# Patient Record
Sex: Male | Born: 1995 | Race: Black or African American | Hispanic: No | Marital: Single | Smoking: Current every day smoker
Health system: Southern US, Community
[De-identification: ages and names within clinical notes are randomized; demographics above are authoritative.]

## PROBLEM LIST (undated history)

## (undated) DIAGNOSIS — F200 Paranoid schizophrenia: Secondary | ICD-10-CM

## (undated) DIAGNOSIS — F909 Attention-deficit hyperactivity disorder, unspecified type: Secondary | ICD-10-CM

## (undated) DIAGNOSIS — F329 Major depressive disorder, single episode, unspecified: Secondary | ICD-10-CM

## (undated) DIAGNOSIS — F32A Depression, unspecified: Secondary | ICD-10-CM

## (undated) DIAGNOSIS — Z21 Asymptomatic human immunodeficiency virus [HIV] infection status: Secondary | ICD-10-CM

## (undated) HISTORY — PX: ADENOIDECTOMY W/ MYRINGOTOMY: SHX1128

---
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
## 2014-08-12 ENCOUNTER — Encounter (HOSPITAL_BASED_OUTPATIENT_CLINIC_OR_DEPARTMENT_OTHER): Payer: Self-pay | Admitting: *Deleted

## 2014-08-12 ENCOUNTER — Emergency Department (HOSPITAL_BASED_OUTPATIENT_CLINIC_OR_DEPARTMENT_OTHER)
Admission: EM | Admit: 2014-08-12 | Discharge: 2014-08-12 | Disposition: A | Discharge status: Home / Self Care | Payer: Medicaid Other | Attending: Emergency Medicine | Admitting: Emergency Medicine

## 2014-08-12 DIAGNOSIS — Z7951 Long term (current) use of inhaled steroids: Secondary | ICD-10-CM | POA: Insufficient documentation

## 2014-08-12 DIAGNOSIS — F909 Attention-deficit hyperactivity disorder, unspecified type: Secondary | ICD-10-CM | POA: Insufficient documentation

## 2014-08-12 DIAGNOSIS — A5409 Other gonococcal infection of lower genitourinary tract: Secondary | ICD-10-CM | POA: Insufficient documentation

## 2014-08-12 DIAGNOSIS — F329 Major depressive disorder, single episode, unspecified: Secondary | ICD-10-CM | POA: Insufficient documentation

## 2014-08-12 DIAGNOSIS — Z79899 Other long term (current) drug therapy: Secondary | ICD-10-CM | POA: Insufficient documentation

## 2014-08-12 DIAGNOSIS — A549 Gonococcal infection, unspecified: Secondary | ICD-10-CM

## 2014-08-12 LAB — URINALYSIS, ROUTINE W REFLEX MICROSCOPIC
BILIRUBIN URINE: NEGATIVE
Glucose, UA: NEGATIVE mg/dL
HGB URINE DIPSTICK: NEGATIVE
Ketones, ur: 15 mg/dL — AB
Nitrite: NEGATIVE
Protein, ur: NEGATIVE mg/dL
SPECIFIC GRAVITY, URINE: 1.031 — AB (ref 1.005–1.030)
UROBILINOGEN UA: 1 mg/dL (ref 0.0–1.0)
pH: 6.5 (ref 5.0–8.0)

## 2014-08-12 LAB — URINE MICROSCOPIC-ADD ON

## 2014-08-12 MED ORDER — CEFTRIAXONE SODIUM 250 MG IJ SOLR
250.0000 mg | Freq: Once | INTRAMUSCULAR | Status: AC
  Administered 2014-08-12: 250 mg via INTRAMUSCULAR
  Filled 2014-08-12: qty 250

## 2014-08-12 MED ORDER — DOXYCYCLINE HYCLATE 100 MG PO CAPS: 100.0000 mg | ORAL_CAPSULE | Freq: Two times a day (BID) | ORAL | Status: DC

## 2014-08-12 NOTE — ED Notes (Signed)
Std exposure. Denies discharge.

## 2014-08-12 NOTE — ED Notes (Signed)
No s/s of a reaction.

## 2014-08-12 NOTE — ED Provider Notes (Signed)
CSN: [id number]     Arrival date & time 08/12/14  1413 History   First MD Initiated Contact with Patient 08/12/14 1524     Chief Complaint  Patient presents with  . SEXUALLY TRANSMITTED DISEASE     (Consider location/radiation/quality/duration/timing/severity/associated sxs/prior Treatment) The history is provided by the patient.   patient presents for treatment for gonorrhea. Reportedly was at his primary care doctor and was tested. He states he was called and told he has gonorrhea. he has had no symptoms and does not know where he got it from. Denies discharge. Denies pain. States they tested a rectal swab urine and blood work. He says he was told to go to the ER because of be easier to be treated that way.  Past Medical History  Diagnosis Date  . ADHD (attention deficit hyperactivity disorder)   . Depression    Past Surgical History  Procedure Laterality Date  . Adenoidectomy w/ myringotomy     No family history on file. History  Substance Use Topics  . Smoking status: Never Smoker   . Smokeless tobacco: Not on file  . Alcohol Use: No    Review of Systems  Constitutional: Negative for fever and chills.  Gastrointestinal: Negative for abdominal pain.  Genitourinary: Negative for flank pain, discharge, genital sores and penile pain.  Skin: Negative for rash.  Neurological: Negative for weakness.      Allergies  Review of patient's allergies indicates no known allergies.  Home Medications   Prior to Admission medications   Medication Sig Start Date End Date Taking? Authorizing Provider  buPROPion (WELLBUTRIN XL) 300 MG 24 hr tablet Take 300 mg by mouth daily.    Historical Provider, MD  citalopram (CELEXA) 40 MG tablet Take 40 mg by mouth daily.    Historical Provider, MD  doxycycline (VIBRAMYCIN) 100 MG capsule Take 1 capsule (100 mg total) by mouth 2 (two) times daily. 08/12/14   Benjiman CoreNathan Ravin Denardo, MD  fluticasone (FLONASE) 50 MCG/ACT nasal spray Place 2 sprays into  the nose daily.    Historical Provider, MD  guaiFENesin (ROBITUSSIN) 100 MG/5ML liquid Take 5-10 mLs (100-200 mg total) by mouth every 4 (four) hours as needed for cough. 08/17/12   Fayrene HelperBowie Tran, PA-C  magnesium citrate solution Take 296 mLs by mouth once. 12/27/11   Geoffery Lyonsouglas Delo, MD  methylphenidate (CONCERTA) 54 MG CR tablet Take 54 mg by mouth every morning.    Historical Provider, MD  Montelukast Sodium (SINGULAIR PO) Take by mouth.    Historical Provider, MD  risperiDONE (RISPERDAL) 2 MG tablet Take 2 mg by mouth 2 (two) times daily. 1/2 in the a.m. and a whole one at night    Historical Provider, MD  traZODone (DESYREL) 50 MG tablet Take 50 mg by mouth at bedtime.    Historical Provider, MD   BP 101/60 mmHg  Pulse 66  Temp(Src) 98.3 F (36.8 C) (Oral)  Resp 18  Ht 5\' 5"  (1.651 m)  Wt 150 lb (68.04 kg)  BMI 24.96 kg/m2  SpO2 99% Physical Exam  Constitutional: He appears well-developed.  Genitourinary: Penis normal. No penile tenderness.  No penile discharge or lesions  Musculoskeletal: Normal range of motion.  Skin: Skin is warm.    ED Course  Procedures (including critical care time) Labs Review Labs Reviewed  URINALYSIS, ROUTINE W REFLEX MICROSCOPIC (NOT AT [facility]) - Abnormal; Notable for the following:    Specific Gravity, Urine 1.031 (*)    Ketones, ur 15 (*)    Leukocytes,  UA SMALL (*)    All other components within normal limits  URINE MICROSCOPIC-ADD ON    Imaging Review No results found.   EKG Interpretation None      MDM   Final diagnoses:  Gonorrhea  patient reportedly with gonorrhea. I will assume he had proper testing and will be followed by the people who order the testing for necessarily public health issues and health department reporting. Given Rocephin and prescription for doxycycline and discharged home.  Benjiman Core, MD 08/12/14 [phone]

## 2014-08-12 NOTE — Discharge Instructions (Signed)

## 2015-03-13 ENCOUNTER — Encounter (HOSPITAL_BASED_OUTPATIENT_CLINIC_OR_DEPARTMENT_OTHER): Payer: Self-pay | Admitting: *Deleted

## 2015-03-13 ENCOUNTER — Emergency Department (HOSPITAL_BASED_OUTPATIENT_CLINIC_OR_DEPARTMENT_OTHER)
Admission: EM | Admit: 2015-03-13 | Discharge: 2015-03-13 | Disposition: A | Discharge status: Home / Self Care | Payer: BLUE CROSS/BLUE SHIELD | Attending: Emergency Medicine | Admitting: Emergency Medicine

## 2015-03-13 DIAGNOSIS — Z792 Long term (current) use of antibiotics: Secondary | ICD-10-CM | POA: Insufficient documentation

## 2015-03-13 DIAGNOSIS — J029 Acute pharyngitis, unspecified: Secondary | ICD-10-CM | POA: Diagnosis not present

## 2015-03-13 DIAGNOSIS — F909 Attention-deficit hyperactivity disorder, unspecified type: Secondary | ICD-10-CM | POA: Insufficient documentation

## 2015-03-13 DIAGNOSIS — Z79899 Other long term (current) drug therapy: Secondary | ICD-10-CM | POA: Insufficient documentation

## 2015-03-13 DIAGNOSIS — Z7951 Long term (current) use of inhaled steroids: Secondary | ICD-10-CM | POA: Insufficient documentation

## 2015-03-13 DIAGNOSIS — F329 Major depressive disorder, single episode, unspecified: Secondary | ICD-10-CM | POA: Diagnosis not present

## 2015-03-13 LAB — RAPID STREP SCREEN (MED CTR MEBANE ONLY): STREPTOCOCCUS, GROUP A SCREEN (DIRECT): NEGATIVE

## 2015-03-13 MED ORDER — NAPROXEN 375 MG PO TABS: 375.0000 mg | ORAL_TABLET | Freq: Two times a day (BID) | ORAL | Status: DC

## 2015-03-13 MED ORDER — IBUPROFEN 800 MG PO TABS
800.0000 mg | ORAL_TABLET | Freq: Once | ORAL | Status: AC
  Administered 2015-03-13: 800 mg via ORAL
  Filled 2015-03-13: qty 1

## 2015-03-13 NOTE — ED Provider Notes (Signed)
CSN: [id number]     Arrival date & time 03/13/15  0335 History   First MD Initiated Contact with Patient 03/13/15 [phone]     Chief Complaint  Patient presents with  . Sore Throat     (Consider location/radiation/quality/duration/timing/severity/associated sxs/prior Treatment) Patient is a 20 y.o. male presenting with pharyngitis. The history is provided by the patient.  Sore Throat This is a new problem. The current episode started 2 days ago. The problem occurs constantly. The problem has not changed since onset.Pertinent negatives include no chest pain, no abdominal pain, no headaches and no shortness of breath. Nothing aggravates the symptoms. Nothing relieves the symptoms. He has tried nothing for the symptoms. The treatment provided no relief.    Past Medical History  Diagnosis Date  . ADHD (attention deficit hyperactivity disorder)   . Depression    Past Surgical History  Procedure Laterality Date  . Adenoidectomy w/ myringotomy     History reviewed. No pertinent family history. Social History  Substance Use Topics  . Smoking status: Never Smoker   . Smokeless tobacco: None  . Alcohol Use: No    Review of Systems  Constitutional: Negative for fever.  HENT: Positive for congestion and sore throat. Negative for drooling and voice change.   Respiratory: Negative for shortness of breath.   Cardiovascular: Negative for chest pain.  Gastrointestinal: Negative for abdominal pain.  Neurological: Negative for headaches.  All other systems reviewed and are negative.     Allergies  Review of patient's allergies indicates no known allergies.  Home Medications   Prior to Admission medications   Medication Sig Start Date End Date Taking? Authorizing Provider  buPROPion (WELLBUTRIN XL) 300 MG 24 hr tablet Take 300 mg by mouth daily.    Historical Provider, MD  citalopram (CELEXA) 40 MG tablet Take 40 mg by mouth daily.    Historical Provider, MD  doxycycline (VIBRAMYCIN) 100  MG capsule Take 1 capsule (100 mg total) by mouth 2 (two) times daily. 08/12/14   Benjiman Core, MD  fluticasone (FLONASE) 50 MCG/ACT nasal spray Place 2 sprays into the nose daily.    Historical Provider, MD  guaiFENesin (ROBITUSSIN) 100 MG/5ML liquid Take 5-10 mLs (100-200 mg total) by mouth every 4 (four) hours as needed for cough. 08/17/12   Fayrene Helper, PA-C  magnesium citrate solution Take 296 mLs by mouth once. 12/27/11   Geoffery Lyons, MD  methylphenidate (CONCERTA) 54 MG CR tablet Take 54 mg by mouth every morning.    Historical Provider, MD  Montelukast Sodium (SINGULAIR PO) Take by mouth.    Historical Provider, MD  risperiDONE (RISPERDAL) 2 MG tablet Take 2 mg by mouth 2 (two) times daily. 1/2 in the a.m. and a whole one at night    Historical Provider, MD  traZODone (DESYREL) 50 MG tablet Take 50 mg by mouth at bedtime.    Historical Provider, MD   BP 125/66 mmHg  Pulse 66  Temp(Src) 99.5 F (37.5 C) (Oral)  Resp 16  Ht  (1.651 m)  Wt 148 lb (67.132 kg)  BMI 24.63 kg/m2  SpO2 100% Physical Exam  Constitutional: He is oriented to person, place, and time. He appears well-developed and well-nourished.  HENT:  Head: Normocephalic and atraumatic.  Mouth/Throat: Oropharynx is clear and moist.  Eyes: Conjunctivae and EOM are normal. Pupils are equal, round, and reactive to light.  Neck: Normal range of motion. Neck supple.  Intact phonation no pain with displacement of the trachea  Cardiovascular: Normal  rate, regular rhythm and intact distal pulses.   Pulmonary/Chest: Effort normal and breath sounds normal. No respiratory distress. He has no wheezes. He has no rales.  Abdominal: Soft. Bowel sounds are normal. There is no tenderness. There is no rebound and no guarding.  Musculoskeletal: Normal range of motion.  Lymphadenopathy:    He has no cervical adenopathy.  Neurological: He is alert and oriented to person, place, and time.  Skin: Skin is warm and dry.  Psychiatric: He  has a normal mood and affect.    ED Course  Procedures (including critical care time) Labs Review Labs Reviewed  RAPID STREP SCREEN (NOT AT [facility])  CULTURE, GROUP A STREP [facility])    Imaging Review No results found. I have personally reviewed and evaluated these images and lab results as part of my medical decision-making.   EKG Interpretation None      MDM   Final diagnoses:  None   Based on CENTOR score no indication for further testing or treatment  Viral pharyngitis.  Will treat symptomatically    Mylynn Dinh, MD 03/13/15 [phone]

## 2015-03-13 NOTE — ED Notes (Signed)
Sore throat x 2 days  Denies any other sx

## 2015-03-13 NOTE — Discharge Instructions (Signed)

## 2015-03-13 NOTE — ED Notes (Signed)
Sore throat onset 2 days ago got better and then returned yesterday am, denies any other symptoms

## 2015-03-14 ENCOUNTER — Emergency Department (HOSPITAL_BASED_OUTPATIENT_CLINIC_OR_DEPARTMENT_OTHER)
Admission: EM | Admit: 2015-03-14 | Discharge: 2015-03-15 | Disposition: A | Discharge status: Home / Self Care | Payer: BLUE CROSS/BLUE SHIELD | Attending: Emergency Medicine | Admitting: Emergency Medicine

## 2015-03-14 ENCOUNTER — Encounter (HOSPITAL_BASED_OUTPATIENT_CLINIC_OR_DEPARTMENT_OTHER): Payer: Self-pay | Admitting: Emergency Medicine

## 2015-03-14 DIAGNOSIS — F909 Attention-deficit hyperactivity disorder, unspecified type: Secondary | ICD-10-CM | POA: Insufficient documentation

## 2015-03-14 DIAGNOSIS — Z792 Long term (current) use of antibiotics: Secondary | ICD-10-CM | POA: Insufficient documentation

## 2015-03-14 DIAGNOSIS — Z79899 Other long term (current) drug therapy: Secondary | ICD-10-CM | POA: Insufficient documentation

## 2015-03-14 DIAGNOSIS — Z791 Long term (current) use of non-steroidal anti-inflammatories (NSAID): Secondary | ICD-10-CM | POA: Diagnosis not present

## 2015-03-14 DIAGNOSIS — Z7951 Long term (current) use of inhaled steroids: Secondary | ICD-10-CM | POA: Diagnosis not present

## 2015-03-14 DIAGNOSIS — K529 Noninfective gastroenteritis and colitis, unspecified: Secondary | ICD-10-CM | POA: Insufficient documentation

## 2015-03-14 DIAGNOSIS — F329 Major depressive disorder, single episode, unspecified: Secondary | ICD-10-CM | POA: Insufficient documentation

## 2015-03-14 DIAGNOSIS — R109 Unspecified abdominal pain: Secondary | ICD-10-CM | POA: Diagnosis present

## 2015-03-14 MED ORDER — ACETAMINOPHEN 325 MG PO TABS
650.0000 mg | ORAL_TABLET | Freq: Once | ORAL | Status: AC | PRN
  Administered 2015-03-14: 650 mg via ORAL
  Filled 2015-03-14: qty 2

## 2015-03-14 NOTE — ED Notes (Signed)
Stomach "cramps" since 4pm.  Vomited x4. Seen here for sore throat 2 days ago.

## 2015-03-15 LAB — BASIC METABOLIC PANEL
Anion gap: 9 (ref 5–15)
BUN: 14 mg/dL (ref 6–20)
CALCIUM: 9.2 mg/dL (ref 8.9–10.3)
CO2: 27 mmol/L (ref 22–32)
Chloride: 101 mmol/L (ref 101–111)
Creatinine, Ser: 1.03 mg/dL (ref 0.61–1.24)
GFR calc Af Amer: >60 mL/min (ref >60)
Glucose, Bld: 104 mg/dL — ABNORMAL HIGH (ref 65–99)
POTASSIUM: 3.6 mmol/L (ref 3.5–5.1)
SODIUM: 137 mmol/L (ref 135–145)

## 2015-03-15 LAB — CULTURE, GROUP A STREP (THRC)

## 2015-03-15 LAB — CBC WITH DIFFERENTIAL/PLATELET
Basophils Absolute: 0 10*3/uL (ref 0.0–0.1)
Basophils Relative: 0 %
Eosinophils Absolute: 0 10*3/uL (ref 0.0–0.7)
Eosinophils Relative: 0 %
HEMATOCRIT: 37 % — AB (ref 39.0–52.0)
HEMOGLOBIN: 12.2 g/dL — AB (ref 13.0–17.0)
Lymphocytes Relative: 10 %
Lymphs Abs: 0.8 10*3/uL (ref 0.7–4.0)
MCH: 30 pg (ref 26.0–34.0)
MCHC: 33 g/dL (ref 30.0–36.0)
MCV: 90.9 fL (ref 78.0–100.0)
MONO ABS: 0.7 10*3/uL (ref 0.1–1.0)
Monocytes Relative: 8 %
NEUTROS ABS: 7 10*3/uL (ref 1.7–7.7)
NEUTROS PCT: 82 %
Platelets: 191 10*3/uL (ref 150–400)
RBC: 4.07 MIL/uL — AB (ref 4.22–5.81)
RDW: 11.4 % — ABNORMAL LOW (ref 11.5–15.5)
WBC: 8.5 10*3/uL (ref 4.0–10.5)

## 2015-03-15 MED ORDER — SODIUM CHLORIDE 0.9 % IV BOLUS (SEPSIS)
1000.0000 mL | Freq: Once | INTRAVENOUS | Status: AC
  Administered 2015-03-15: 1000 mL via INTRAVENOUS

## 2015-03-15 MED ORDER — ONDANSETRON HCL 4 MG/2ML IJ SOLN
4.0000 mg | Freq: Once | INTRAMUSCULAR | Status: AC
  Administered 2015-03-15: 4 mg via INTRAVENOUS
  Filled 2015-03-15: qty 2

## 2015-03-15 MED ORDER — KETOROLAC TROMETHAMINE 30 MG/ML IJ SOLN
30.0000 mg | Freq: Once | INTRAMUSCULAR | Status: AC
  Administered 2015-03-15: 30 mg via INTRAVENOUS
  Filled 2015-03-15: qty 1

## 2015-03-15 MED ORDER — ONDANSETRON 8 MG PO TBDP: ORAL_TABLET | ORAL | Status: DC

## 2015-03-15 NOTE — Discharge Instructions (Signed)
Zofran as prescribed as needed for nausea.  Return to the emergency department if he develops severe abdominal pain, bloody stools, no urine output in 12 hours, or other new and concerning symptoms.

## 2015-03-15 NOTE — ED Provider Notes (Signed)
CSN: [id number]     Arrival date & time 03/14/15  2340 History  By signing my name below, I, Terrance Branch, attest that this documentation has been prepared under the direction and in the presence of Geoffery Lyons, MD. Electronically Signed: Evon Slack, ED Scribe. 03/15/2015. 12:05 AM.     Chief Complaint  Patient presents with  . Abdominal Pain   Patient is a 20 y.o. male presenting with abdominal pain. The history is provided by the patient. No language interpreter was used.  Abdominal Pain Associated symptoms: chills, diarrhea, nausea and vomiting    HPI Comments: Gary Bird is a 20 y.o. male who presents to the Emergency Department complaining of cramping abdominal pain onset today. Pt reports associated chills, vomiting and diarrhea. Pt doesn't report any medications PTA.  Pt denies any recent sick contacts. Pt does report recently being seen in the ED for viral pharyngitis.    Past Medical History  Diagnosis Date  . ADHD (attention deficit hyperactivity disorder)   . Depression    Past Surgical History  Procedure Laterality Date  . Adenoidectomy w/ myringotomy     No family history on file. Social History  Substance Use Topics  . Smoking status: Never Smoker   . Smokeless tobacco: None  . Alcohol Use: No    Review of Systems  Constitutional: Positive for chills.  Gastrointestinal: Positive for nausea, vomiting, abdominal pain and diarrhea.     Allergies  Review of patient's allergies indicates no known allergies.  Home Medications   Prior to Admission medications   Medication Sig Start Date End Date Taking? Authorizing Provider  buPROPion (WELLBUTRIN XL) 300 MG 24 hr tablet Take 300 mg by mouth daily.    Historical Provider, MD  citalopram (CELEXA) 40 MG tablet Take 40 mg by mouth daily.    Historical Provider, MD  doxycycline (VIBRAMYCIN) 100 MG capsule Take 1 capsule (100 mg total) by mouth 2 (two) times daily. 08/12/14   Benjiman Core, MD   fluticasone (FLONASE) 50 MCG/ACT nasal spray Place 2 sprays into the nose daily.    Historical Provider, MD  guaiFENesin (ROBITUSSIN) 100 MG/5ML liquid Take 5-10 mLs (100-200 mg total) by mouth every 4 (four) hours as needed for cough. 08/17/12   Fayrene Helper, PA-C  magnesium citrate solution Take 296 mLs by mouth once. 12/27/11   Geoffery Lyons, MD  methylphenidate (CONCERTA) 54 MG CR tablet Take 54 mg by mouth every morning.    Historical Provider, MD  Montelukast Sodium (SINGULAIR PO) Take by mouth.    Historical Provider, MD  naproxen (NAPROSYN) 375 MG tablet Take 1 tablet (375 mg total) by mouth 2 (two) times daily. 03/13/15   April Palumbo, MD  risperiDONE (RISPERDAL) 2 MG tablet Take 2 mg by mouth 2 (two) times daily. 1/2 in the a.m. and a whole one at night    Historical Provider, MD  traZODone (DESYREL) 50 MG tablet Take 50 mg by mouth at bedtime.    Historical Provider, MD   BP 126/75 mmHg  Pulse 88  Temp(Src) 102.8 F (39.3 C) (Oral)  Resp 16  Ht  (1.651 m)  Wt 148 lb (67.132 kg)  BMI 24.63 kg/m2  SpO2 98%   Physical Exam  Constitutional: He is oriented to person, place, and time. He appears well-developed and well-nourished. No distress.  HENT:  Head: Normocephalic and atraumatic.  Mouth/Throat: Oropharynx is clear and moist. No oropharyngeal exudate.  Eyes: Conjunctivae and EOM are normal.  Neck: Neck supple.  No tracheal deviation present.  Cardiovascular: Normal rate, regular rhythm and normal heart sounds.   No murmur heard. Pulmonary/Chest: Effort normal and breath sounds normal. No respiratory distress. He has no wheezes. He has no rales.  Abdominal: Soft. There is no tenderness.  Musculoskeletal: Normal range of motion.  Neurological: He is alert and oriented to person, place, and time.  Skin: Skin is warm and dry. He is not diaphoretic.  Psychiatric: He has a normal mood and affect. His behavior is normal.  Nursing note and vitals reviewed.   ED Course   Procedures (including critical care time) DIAGNOSTIC STUDIES: Oxygen Saturation is 98% on RA, normal by my interpretation.    COORDINATION OF CARE: 12:06 AM-Discussed treatment plan with pt at bedside and pt agreed to plan.     Labs Review Labs Reviewed - No data to display  Imaging Review No results found.    EKG Interpretation None      MDM   Final diagnoses:  None      Patient presents with nausea, vomiting, and diarrhea. He also has a fever. His abdomen is benign. His presentation, exam, and laboratory studies are consistent with a gastroenteritis, likely viral in nature. He is feeling better after fluids and medications. He will be discharged with Zofran, clear liquids, and when necessary return.   I personally performed the services described in this documentation, which was scribed in my presence. The recorded information has been reviewed and is accurate.       Geoffery Lyons, MD 03/15/15 [phone]

## 2015-06-02 ENCOUNTER — Emergency Department (HOSPITAL_BASED_OUTPATIENT_CLINIC_OR_DEPARTMENT_OTHER)
Admission: EM | Admit: 2015-06-02 | Discharge: 2015-06-02 | Disposition: A | Discharge status: Home / Self Care | Payer: BLUE CROSS/BLUE SHIELD | Attending: Emergency Medicine | Admitting: Emergency Medicine

## 2015-06-02 ENCOUNTER — Encounter (HOSPITAL_BASED_OUTPATIENT_CLINIC_OR_DEPARTMENT_OTHER): Payer: Self-pay | Admitting: *Deleted

## 2015-06-02 DIAGNOSIS — F329 Major depressive disorder, single episode, unspecified: Secondary | ICD-10-CM | POA: Insufficient documentation

## 2015-06-02 DIAGNOSIS — L02416 Cutaneous abscess of left lower limb: Secondary | ICD-10-CM | POA: Diagnosis not present

## 2015-06-02 DIAGNOSIS — L03115 Cellulitis of right lower limb: Secondary | ICD-10-CM | POA: Diagnosis present

## 2015-06-02 DIAGNOSIS — L02415 Cutaneous abscess of right lower limb: Secondary | ICD-10-CM

## 2015-06-02 MED ORDER — SULFAMETHOXAZOLE-TRIMETHOPRIM 800-160 MG PO TABS: 1.0000 | ORAL_TABLET | Freq: Two times a day (BID) | ORAL | Status: AC

## 2015-06-02 MED ORDER — CEPHALEXIN 500 MG PO CAPS: 500.0000 mg | ORAL_CAPSULE | Freq: Three times a day (TID) | ORAL | Status: DC

## 2015-06-02 NOTE — Discharge Instructions (Signed)
Abscess °An abscess is an infected area that contains a collection of pus and debris. It can occur in almost any part of the body. An abscess is also known as a furuncle or boil. °CAUSES  °An abscess occurs when tissue gets infected. This can occur from blockage of oil or sweat glands, infection of hair follicles, or a minor injury to the skin. As the body tries to fight the infection, pus collects in the area and creates pressure under the skin. This pressure causes pain. People with weakened immune systems have difficulty fighting infections and get certain abscesses more often.  °SYMPTOMS °Usually an abscess develops on the skin and becomes a painful mass that is red, warm, and tender. If the abscess forms under the skin, you may feel a moveable soft area under the skin. Some abscesses break open (rupture) on their own, but most will continue to get worse without care. The infection can spread deeper into the body and eventually into the bloodstream, causing you to feel ill.  °DIAGNOSIS  °Your caregiver will take your medical history and perform a physical exam. A sample of fluid may also be taken from the abscess to determine what is causing your infection. °TREATMENT  °Your caregiver may prescribe antibiotic medicines to fight the infection. However, taking antibiotics alone usually does not cure an abscess. Your caregiver may need to make a small cut (incision) in the abscess to drain the pus. In some cases, gauze is packed into the abscess to reduce pain and to continue draining the area. °HOME CARE INSTRUCTIONS  °· Only take over-the-counter or prescription medicines for pain, discomfort, or fever as directed by your caregiver. °· If you were prescribed antibiotics, take them as directed. Finish them even if you start to feel better. °· If gauze is used, follow your caregiver's directions for changing the gauze. °· To avoid spreading the infection: °· Keep your draining abscess covered with a  bandage. °· Wash your hands well. °· Do not share personal care items, towels, or whirlpools with others. °· Avoid skin contact with others. °· Keep your skin and clothes clean around the abscess. °· Keep all follow-up appointments as directed by your caregiver. °SEEK MEDICAL CARE IF:  °· You have increased pain, swelling, redness, fluid drainage, or bleeding. °· You have muscle aches, chills, or a general ill feeling. °· You have a fever. °MAKE SURE YOU:  °· Understand these instructions. °· Will watch your condition. °· Will get help right away if you are not doing well or get worse. °  °This information is not intended to replace advice given to you by your health care provider. Make sure you discuss any questions you have with your health care provider. °  °Document Released: 10/20/2004 Document Revised: 07/12/2011 Document Reviewed: 03/25/2011 °Elsevier Interactive Patient Education ©2016 [employer] ° °Incision and Drainage °Incision and drainage is a procedure in which a sac-like structure (cystic structure) is opened and drained. The area to be drained usually contains material such as pus, fluid, or blood.  °LET YOUR CAREGIVER KNOW ABOUT:  °· Allergies to medicine. °· Medicines taken, including vitamins, herbs, eyedrops, over-the-counter medicines, and creams. °· Use of steroids (by mouth or creams). °· Previous problems with anesthetics or numbing medicines. °· History of bleeding problems or blood clots. °· Previous surgery. °· Other health problems, including diabetes and kidney problems. °· Possibility of pregnancy, if this applies. °RISKS AND COMPLICATIONS °· Pain. °· Bleeding. °· Scarring. °· Infection. °BEFORE THE PROCEDURE  °  You may need to have an ultrasound or other imaging tests to see how large or deep your cystic structure is. Blood tests may also be used to determine if you have an infection or how severe the infection is. You may need to have a tetanus shot. °PROCEDURE  °The affected area  is cleaned with a cleaning fluid. The cyst area will then be numbed with a medicine (local anesthetic). A small incision will be made in the cystic structure. A syringe or catheter may be used to drain the contents of the cystic structure, or the contents may be squeezed out. The area will then be flushed with a cleansing solution. After cleansing the area, it is often gently packed with a gauze or another wound dressing. Once it is packed, it will be covered with gauze and tape or some other type of wound dressing.  °AFTER THE PROCEDURE  °· Often, you will be allowed to go home right after the procedure. °· You may be given antibiotic medicine to prevent or heal an infection. °· If the area was packed with gauze or some other wound dressing, you will likely need to come back in 1 to 2 days to get it removed. °· The area should heal in about 14 days. °  °This information is not intended to replace advice given to you by your health care provider. Make sure you discuss any questions you have with your health care provider. °  °Document Released: 07/06/2000 Document Revised: 07/12/2011 Document Reviewed: 03/07/2011 °Elsevier Interactive Patient Education ©2016 [employer] Inc[employer] ° °

## 2015-06-02 NOTE — ED Provider Notes (Signed)
CSN: [id number]     Arrival date & time 06/02/15  2234 History   First MD Initiated Contact with Patient 06/02/15 2316     Chief Complaint  Patient presents with  . Skin Ulcer     (Consider location/radiation/quality/duration/timing/severity/associated sxs/prior Treatment) HPI This is a 20 year old male who comes into the emergency department with chief complaint of infection of the right lower leg. He noticed a small pimple-like protrusion on the right lower leg that developed several days ago. Patient has had worsening swelling, heat, redness and a feeling of pressure in the leg. He has a past medical history of previous boils. His mother also gets them. He denies any fevers, chills, nausea, vomiting, or other symptoms of systemic infection.  Past Medical History  Diagnosis Date  . ADHD (attention deficit hyperactivity disorder)   . Depression    Past Surgical History  Procedure Laterality Date  . Adenoidectomy w/ myringotomy     No family history on file. Social History  Substance Use Topics  . Smoking status: Never Smoker   . Smokeless tobacco: None  . Alcohol Use: No    Review of Systems Ten systems reviewed and are negative for acute change, except as noted in the HPI.     Allergies  Review of patient's allergies indicates no known allergies.  Home Medications   Prior to Admission medications   Medication Sig Start Date End Date Taking? Authorizing Provider  buPROPion (WELLBUTRIN XL) 300 MG 24 hr tablet Take 300 mg by mouth daily.    Historical Provider, MD  citalopram (CELEXA) 40 MG tablet Take 40 mg by mouth daily.    Historical Provider, MD  doxycycline (VIBRAMYCIN) 100 MG capsule Take 1 capsule (100 mg total) by mouth 2 (two) times daily. 08/12/14   Benjiman Core, MD  fluticasone (FLONASE) 50 MCG/ACT nasal spray Place 2 sprays into the nose daily.    Historical Provider, MD  guaiFENesin (ROBITUSSIN) 100 MG/5ML liquid Take 5-10 mLs (100-200 mg total) by mouth  every 4 (four) hours as needed for cough. 08/17/12   Fayrene Helper, PA-C  magnesium citrate solution Take 296 mLs by mouth once. 12/27/11   Geoffery Lyons, MD  methylphenidate (CONCERTA) 54 MG CR tablet Take 54 mg by mouth every morning.    Historical Provider, MD  Montelukast Sodium (SINGULAIR PO) Take by mouth.    Historical Provider, MD  naproxen (NAPROSYN) 375 MG tablet Take 1 tablet (375 mg total) by mouth 2 (two) times daily. 03/13/15   April Palumbo, MD  ondansetron (ZOFRAN ODT) 8 MG disintegrating tablet  ODT q4 hours prn nausea 03/15/15   Geoffery Lyons, MD  risperiDONE (RISPERDAL) 2 MG tablet Take 2 mg by mouth 2 (two) times daily. 1/2 in the a.m. and a whole one at night    Historical Provider, MD  traZODone (DESYREL) 50 MG tablet Take 50 mg by mouth at bedtime.    Historical Provider, MD   BP 107/71 mmHg  Pulse 71  Temp(Src) 98.8 F (37.1 C) (Oral)  Resp 17  Ht  (1.651 m)  Wt 67.132 kg  BMI 24.63 kg/m2  SpO2 100% Physical Exam Physical Exam  Nursing note and vitals reviewed. Constitutional: He appears well-developed and well-nourished. No distress.  HENT:  Head: Normocephalic and atraumatic.  Eyes: Conjunctivae normal are normal. No scleral icterus.  Neck: Normal range of motion. Neck supple.  Cardiovascular: Normal rate, regular rhythm and normal heart sounds.   Pulmonary/Chest: Effort normal and breath sounds normal. No respiratory  distress.  Abdominal: Soft. There is no tenderness.  Musculoskeletal: He exhibits no edema.  Neurological: He is alert.  Skin: Skin is warm and dry. He is not diaphoretic.  Right lower extremity with 4 cm area of erythema with a central ulceration actively draining purulent and bloody drainage. No lymphangitis. Psychiatric: His behavior is normal.   INCISION AND DRAINAGE Performed by: Arthor CaptainHarris, Kanyon Bunn Consent: Verbal consent obtained. Risks and benefits: risks, benefits and alternatives were discussed Type: abscess  Body area: Right lower  leg  Anesthesia: local infiltration  Incision was made with a scalpel.  Local anesthetic: lidocaine 2% % with epinephrine  Anesthetic total: 3 ml  Complexity: complex Blunt dissection to break up loculations  Drainage: purulent  Drainage amount: Moderate   Packing material: 1/4 in iodoform gauze  Patient tolerance: Patient tolerated the procedure well with no immediate complications.    ED Course  Procedures (including critical care time) Labs Review Labs Reviewed - No data to display  Imaging Review No results found. I have personally reviewed and evaluated these images and lab results as part of my medical decision-making.   EKG Interpretation None      MDM   Final diagnoses:  Abscess of leg, right    Patient with skin abscess amenable to incision and drainage.  Abscess was not large enough to warrant packing or drain,  wound recheck in 2 days. Encouraged home warm soaks and flushing.  Mild signs of cellulitis is surrounding skin.  Will d/c to home.  Patient discharged with Keflex and Bactrim. Discussed return per Cautions. Abscess drainage sent for culture    Arthor Captainbigail Tyrihanna Wingert, PA-C 06/03/15 0019  Paula LibraJohn Molpus, MD 06/03/15 [phone]

## 2015-06-02 NOTE — ED Notes (Signed)
Provider at bedside for pt eval.

## 2015-06-02 NOTE — ED Notes (Signed)
Provider at bedside to lance wound

## 2015-06-02 NOTE — ED Notes (Signed)
Pt. Reports itching bump started on the Mid calf on the R leg with redness noted and draining. Pt. Reports pain in the leg and surrounding tissue.

## 2015-06-06 LAB — CULTURE, ROUTINE-ABSCESS

## 2015-06-07 ENCOUNTER — Telehealth (HOSPITAL_BASED_OUTPATIENT_CLINIC_OR_DEPARTMENT_OTHER): Payer: Self-pay

## 2015-06-07 NOTE — Telephone Encounter (Signed)
Post ED Visit - Positive Culture Follow-up  Culture report reviewed by antimicrobial stewardship pharmacist:  [x]  Gary Bird, Pharm.D. []  Gary Bird, Pharm.D., BCPS []  Gary Bird, Pharm.D. []  Gary Bird, Pharm.D., BCPS []  Gary Bird, [address] BCPS, AAHIVP []  Gary Bird, Pharm.D., BCPS, AAHIVP []  Gary Bird, [address] []  Gary Bird, [address]  Positive urine culture Treated with Sulfamethoxazole-Trimethoprim, organism sensitive to the same and no further patient follow-up is required at this time.  Gary Bird, Gary Bird 06/07/2015, 12:53 PM

## 2015-07-17 ENCOUNTER — Encounter (HOSPITAL_BASED_OUTPATIENT_CLINIC_OR_DEPARTMENT_OTHER): Payer: Self-pay | Admitting: *Deleted

## 2015-07-17 ENCOUNTER — Emergency Department (HOSPITAL_BASED_OUTPATIENT_CLINIC_OR_DEPARTMENT_OTHER)
Admission: EM | Admit: 2015-07-17 | Discharge: 2015-07-17 | Disposition: A | Discharge status: Home / Self Care | Payer: BLUE CROSS/BLUE SHIELD | Attending: Emergency Medicine | Admitting: Emergency Medicine

## 2015-07-17 DIAGNOSIS — F17210 Nicotine dependence, cigarettes, uncomplicated: Secondary | ICD-10-CM | POA: Diagnosis not present

## 2015-07-17 DIAGNOSIS — H109 Unspecified conjunctivitis: Secondary | ICD-10-CM | POA: Diagnosis not present

## 2015-07-17 DIAGNOSIS — H578 Other specified disorders of eye and adnexa: Secondary | ICD-10-CM | POA: Diagnosis present

## 2015-07-17 DIAGNOSIS — F329 Major depressive disorder, single episode, unspecified: Secondary | ICD-10-CM | POA: Insufficient documentation

## 2015-07-17 DIAGNOSIS — F909 Attention-deficit hyperactivity disorder, unspecified type: Secondary | ICD-10-CM | POA: Insufficient documentation

## 2015-07-17 MED ORDER — SULFACETAMIDE SODIUM 10 % OP SOLN: 2.0000 [drp] | OPHTHALMIC | Status: DC

## 2015-07-17 NOTE — ED Notes (Signed)
Pt c/o bil eye redness x 2 days. Denies drainage

## 2015-07-17 NOTE — ED Provider Notes (Signed)
CSN: [id number]     Arrival date & time 07/17/15  1652 History   First MD Initiated Contact with Patient 07/17/15 1703     Chief Complaint  Patient presents with  . Eye Problem     (Consider location/radiation/quality/duration/timing/severity/associated sxs/prior Treatment) HPI Patient presents to the emergency department with bilateral eye redness.  The patient states this is been going on for the last 2 days.  He states that he still has some pain in both eyes but no significant discharge.  The patient states that nothing seems make the condition better or worse.  Patient states that he has not had any fever, headache, blurred vision, nausea, vomiting, diarrhea, nasal congestion, cough or syncope.  The patient states he has had a sore throat in the last 3 days, but does not currently have sore throat Past Medical History  Diagnosis Date  . ADHD (attention deficit hyperactivity disorder)   . Depression    Past Surgical History  Procedure Laterality Date  . Adenoidectomy w/ myringotomy     History reviewed. No pertinent family history. Social History  Substance Use Topics  . Smoking status: Current Every Day Smoker    Types: Cigars  . Smokeless tobacco: None  . Alcohol Use: No    Review of Systems   All other systems negative except as documented in the HPI. All pertinent positives and negatives as reviewed in the HPI. Allergies  Review of patient's allergies indicates no known allergies.  Home Medications   Prior to Admission medications   Medication Sig Start Date End Date Taking? Authorizing Provider  fluticasone (FLONASE) 50 MCG/ACT nasal spray Place 2 sprays into the nose daily.    Historical Provider, MD  magnesium citrate solution Take 296 mLs by mouth once. 12/27/11   Geoffery Lyonsouglas Delo, MD  naproxen (NAPROSYN) 375 MG tablet Take 1 tablet (375 mg total) by mouth 2 (two) times daily. 03/13/15   April Palumbo, MD  ondansetron (ZOFRAN ODT) 8 MG disintegrating tablet 8mg  ODT  q4 hours prn nausea 03/15/15   Geoffery Lyonsouglas Delo, MD   BP 114/68 mmHg  Pulse 91  Temp(Src) 99.1 F (37.3 C)  Resp 18  Ht 5\' 4"  (1.626 m)  Wt 64.864 kg  BMI 24.53 kg/m2  SpO2 98% Physical Exam  Constitutional: He is oriented to person, place, and time.  Eyes: EOM and lids are normal. Pupils are equal, round, and reactive to light. Right eye exhibits no discharge. Left eye exhibits no discharge. Right conjunctiva is injected. Left conjunctiva is injected.  Cardiovascular: Normal rate, regular rhythm, normal heart sounds and intact distal pulses.  Exam reveals no gallop and no friction rub.   No murmur heard. Pulmonary/Chest: Effort normal and breath sounds normal. No respiratory distress.  Neurological: He is alert and oriented to person, place, and time.  Skin: Skin is warm and dry. No rash noted.  Nursing note and vitals reviewed.   ED Course  Procedures (including critical care time) Labs Review Labs Reviewed - No data to display  Imaging Review No results found. I have personally reviewed and evaluated these images and lab results as part of my medical decision-making.  Patient be treated for conjunctivitis.  Told to return here as needed.  Patient agrees the plan and all questions were answered    Charlestine NightChristopher Garnie Borchardt, PA-C 07/17/15 1745  Melene Planan Floyd, DO 07/17/15 1746

## 2015-07-17 NOTE — Discharge Instructions (Signed)
Return here as needed. Follow up as needed with the eye doctor provided.

## 2015-11-30 ENCOUNTER — Encounter (HOSPITAL_BASED_OUTPATIENT_CLINIC_OR_DEPARTMENT_OTHER): Payer: Self-pay | Admitting: *Deleted

## 2015-11-30 ENCOUNTER — Emergency Department (HOSPITAL_BASED_OUTPATIENT_CLINIC_OR_DEPARTMENT_OTHER)
Admission: EM | Admit: 2015-11-30 | Discharge: 2015-11-30 | Disposition: A | Discharge status: Home / Self Care | Payer: BLUE CROSS/BLUE SHIELD | Attending: Emergency Medicine | Admitting: Emergency Medicine

## 2015-11-30 DIAGNOSIS — F17210 Nicotine dependence, cigarettes, uncomplicated: Secondary | ICD-10-CM | POA: Diagnosis not present

## 2015-11-30 DIAGNOSIS — Z202 Contact with and (suspected) exposure to infections with a predominantly sexual mode of transmission: Secondary | ICD-10-CM | POA: Insufficient documentation

## 2015-11-30 DIAGNOSIS — F909 Attention-deficit hyperactivity disorder, unspecified type: Secondary | ICD-10-CM | POA: Diagnosis not present

## 2015-11-30 DIAGNOSIS — N5089 Other specified disorders of the male genital organs: Secondary | ICD-10-CM | POA: Diagnosis present

## 2015-11-30 DIAGNOSIS — A64 Unspecified sexually transmitted disease: Secondary | ICD-10-CM

## 2015-11-30 MED ORDER — CEFTRIAXONE SODIUM 250 MG IJ SOLR
250.0000 mg | Freq: Once | INTRAMUSCULAR | Status: AC
  Administered 2015-11-30: 250 mg via INTRAMUSCULAR
  Filled 2015-11-30: qty 250

## 2015-11-30 MED ORDER — PENICILLIN G BENZATHINE 1200000 UNIT/2ML IM SUSP
2.4000 10*6.[IU] | Freq: Once | INTRAMUSCULAR | Status: AC
  Administered 2015-11-30: 2.4 10*6.[IU] via INTRAMUSCULAR
  Filled 2015-11-30: qty 4

## 2015-11-30 MED ORDER — AZITHROMYCIN 250 MG PO TABS
ORAL_TABLET | ORAL | Status: AC
  Filled 2015-11-30: qty 6

## 2015-11-30 MED ORDER — AZITHROMYCIN 600 MG PO TABS
1200.0000 mg | ORAL_TABLET | Freq: Once | ORAL | Status: AC
  Administered 2015-11-30: 1200 mg via ORAL
  Filled 2015-11-30: qty 2

## 2015-11-30 NOTE — ED Triage Notes (Signed)
States he had sex while using a condom. Condom was latex and he is allergic to latex. States he has open sores on his penis now.

## 2015-11-30 NOTE — ED Provider Notes (Signed)
Emergency Department Provider Note By signing my name below, I, Teofilo PodMatthew P. Jamison, attest that this documentation has been prepared under the direction and in the presence of Maia PlanJoshua G Macrina Lehnert, MD . Electronically Signed: Teofilo PodMatthew P. Jamison, ED Scribe. 11/30/2015. 4:16 PM.   I have reviewed the triage vital signs and the nursing notes.   HISTORY  Chief Complaint Abscess   HPI Gary Bird is a 20 y.o. male with PMHx of HIV who presents to the Emergency Department complaining of multiple genital sores x 3 days. Pt states that the sores have opened since onset. Pt reports an allergy to latex. Pt states that he was having sex with a condom, and his partner denied any history of STDs. Pt notes that he normally does not have sex and that this was a rare occurrence. No alleviating factors noted. Pt denies any pain to the area. Pt denies fever, chills, abdominal pain, penile discharge.   Past Medical History:  Diagnosis Date  . ADHD (attention deficit hyperactivity disorder)   . Depression     There are no active problems to display for this patient.   Past Surgical History:  Procedure Laterality Date  . ADENOIDECTOMY W/ MYRINGOTOMY        Allergies Latex  No family history on file.  Social History Social History  Substance Use Topics  . Smoking status: Current Every Day Smoker    Types: Cigarettes  . Smokeless tobacco: Never Used  . Alcohol use No    Review of Systems  Constitutional: No fever/chills Eyes: No visual changes. ENT: No sore throat. Cardiovascular: Denies chest pain. Respiratory: Denies shortness of breath. Gastrointestinal: No abdominal pain.  No nausea, no vomiting.  No diarrhea.  No constipation. Genitourinary: Negative for dysuria. Negative for penile discharge.  Musculoskeletal: Negative for back pain. Skin: Positive for genital sores.  Neurological: Negative for headaches, focal weakness or numbness.  10-point ROS otherwise  negative.  ____________________________________________   PHYSICAL EXAM:  VITAL SIGNS: ED Triage Vitals  Enc Vitals Group     BP 11/30/15 1530 129/75     Pulse Rate 11/30/15 1530 60     Resp 11/30/15 1530 20     Temp 11/30/15 1530 98.2 F (36.8 C)     Temp Source 11/30/15 1530 Oral     SpO2 11/30/15 1530 99 %     Weight 11/30/15 1531 141 lb 11.2 oz (64.3 kg)     Height 11/30/15 1531 5\' 5"  (1.651 m)   Constitutional: Alert and oriented. Well appearing and in no acute distress. Eyes: Conjunctivae are normal. Head: Atraumatic. Nose: No congestion/rhinnorhea. Mouth/Throat: Mucous membranes are moist.  Oropharynx non-erythematous. Neck: No stridor.   Cardiovascular: Normal rate, regular rhythm. Good peripheral circulation. Grossly normal heart sounds.   Respiratory: Normal respiratory effort.  No retractions. Lungs CTAB. Gastrointestinal: Soft and nontender. No distention.  Genitourinary: Three shallow ulcerations to the corpus of the penis. No fluctuance or erythema.  Musculoskeletal: No lower extremity tenderness nor edema. No gross deformities of extremities. Neurologic:  Normal speech and language. No gross focal neurologic deficits are appreciated.  Skin:  Skin is warm, dry and intact. No rash noted.  ____________________________________________   LABS (all labs ordered are listed, but only abnormal results are displayed)  Labs Reviewed  RPR  GC/CHLAMYDIA PROBE AMP ([place]   ____________________________________________  RADIOLOGY  None ____________________________________________   PROCEDURES  Procedure(s) performed:   Procedures  None ____________________________________________   INITIAL IMPRESSION / ASSESSMENT AND  PLAN / ED COURSE  Pertinent labs & imaging results that were available during my care of the patient were reviewed by me and considered in my medical decision making (see chart for details).  Patient resents emergency  department for evaluation of painless genital lesions. The patient has underlying HIV. 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 I sent RPR and treated with 2.4 million units of penicillin IM. Also discussed sending gonorrhea and chlamydia testing along with preemptive treatment with azithromycin and ceftriaxone. [facility] scheduled on the 10th of this month.   At this time, I do not feel there is any life-threatening condition present. I have reviewed and discussed all results (EKG, imaging, lab, urine as appropriate), exam findings with patient. I have reviewed nursing notes and appropriate previous records.  I feel the patient is safe to be discharged home without further emergent workup. Discussed usual and customary return precautions. Patient and family (if present) verbalize understanding and are comfortable with this plan.  Patient will follow-up with their primary care provider. If they do not have a primary care provider, information for follow-up has been provided to them. All questions have been answered.  ____________________________________________  FINAL CLINICAL IMPRESSION(S) / ED DIAGNOSES  Final diagnoses:  STD (male)     MEDICATIONS GIVEN DURING THIS VISIT:  Medications  penicillin g benzathine (BICILLIN LA) 1200000 UNIT/2ML injection 2.4 Million Units (2.4 Million Units Intramuscular Given 11/30/15 1633)  azithromycin (ZITHROMAX) tablet 1,200 mg (1,200 mg Oral Given 11/30/15 1631)  cefTRIAXone (ROCEPHIN) injection 250 mg (250 mg Intramuscular Given 11/30/15 1633)     NEW OUTPATIENT MEDICATIONS STARTED DURING THIS VISIT:  None    Note:  This document was prepared using Dragon voice recognition software and may include unintentional dictation errors.  Alona BeneJoshua Iden Stripling, MD Emergency Medicine  I personally performed the services described in this documentation,  which was scribed in my presence. The recorded information has been reviewed and is accurate.       Maia PlanJoshua G Dexter Sauser, MD 12/01/15 [phone]

## 2015-11-30 NOTE — Discharge Instructions (Signed)
You have been seen today in the Emergency Department (ED) for likely STD. Your workup today shows that you likely have one or more sexually transmitted infections (STIs).  You have been treated with a one time dose medication for both of these conditions.  Please have your partner tested for STD's and do not resume sexual activity until you and your partner have confirmed negative test results or have both been treated.  Please follow up with your doctor as soon as possible regarding today?s ED visit and your symptoms.   Return to the ED if your pain worsens, you develop a fever, or for any other symptoms that concern you.

## 2015-12-01 LAB — RPR, QUANT+TP ABS (REFLEX)
Rapid Plasma Reagin, Quant: 1:2 {titer} — ABNORMAL HIGH
T Pallidum Abs: POSITIVE — AB

## 2015-12-01 LAB — GC/CHLAMYDIA PROBE AMP (~~LOC~~) NOT AT ARMC
Chlamydia: NEGATIVE
Neisseria Gonorrhea: NEGATIVE

## 2015-12-01 LAB — RPR: RPR Ser Ql: REACTIVE — AB

## 2016-03-02 ENCOUNTER — Emergency Department (HOSPITAL_BASED_OUTPATIENT_CLINIC_OR_DEPARTMENT_OTHER)
Admission: EM | Admit: 2016-03-02 | Discharge: 2016-03-02 | Disposition: A | Discharge status: Home / Self Care | Payer: BLUE CROSS/BLUE SHIELD | Attending: Emergency Medicine | Admitting: Emergency Medicine

## 2016-03-02 ENCOUNTER — Encounter (HOSPITAL_BASED_OUTPATIENT_CLINIC_OR_DEPARTMENT_OTHER): Payer: Self-pay

## 2016-03-02 ENCOUNTER — Emergency Department (HOSPITAL_BASED_OUTPATIENT_CLINIC_OR_DEPARTMENT_OTHER): Payer: BLUE CROSS/BLUE SHIELD

## 2016-03-02 DIAGNOSIS — J3489 Other specified disorders of nose and nasal sinuses: Secondary | ICD-10-CM | POA: Insufficient documentation

## 2016-03-02 DIAGNOSIS — J029 Acute pharyngitis, unspecified: Secondary | ICD-10-CM | POA: Diagnosis not present

## 2016-03-02 DIAGNOSIS — R509 Fever, unspecified: Secondary | ICD-10-CM | POA: Diagnosis not present

## 2016-03-02 DIAGNOSIS — R067 Sneezing: Secondary | ICD-10-CM | POA: Insufficient documentation

## 2016-03-02 DIAGNOSIS — J111 Influenza due to unidentified influenza virus with other respiratory manifestations: Secondary | ICD-10-CM

## 2016-03-02 DIAGNOSIS — F909 Attention-deficit hyperactivity disorder, unspecified type: Secondary | ICD-10-CM | POA: Diagnosis not present

## 2016-03-02 DIAGNOSIS — F17210 Nicotine dependence, cigarettes, uncomplicated: Secondary | ICD-10-CM | POA: Diagnosis not present

## 2016-03-02 DIAGNOSIS — Z21 Asymptomatic human immunodeficiency virus [HIV] infection status: Secondary | ICD-10-CM | POA: Diagnosis not present

## 2016-03-02 DIAGNOSIS — R0982 Postnasal drip: Secondary | ICD-10-CM | POA: Insufficient documentation

## 2016-03-02 DIAGNOSIS — R69 Illness, unspecified: Secondary | ICD-10-CM

## 2016-03-02 DIAGNOSIS — R52 Pain, unspecified: Secondary | ICD-10-CM | POA: Insufficient documentation

## 2016-03-02 DIAGNOSIS — R05 Cough: Secondary | ICD-10-CM | POA: Insufficient documentation

## 2016-03-02 DIAGNOSIS — Z79899 Other long term (current) drug therapy: Secondary | ICD-10-CM | POA: Insufficient documentation

## 2016-03-02 HISTORY — DX: Asymptomatic human immunodeficiency virus (hiv) infection status: Z21

## 2016-03-02 MED ORDER — ONDANSETRON HCL 4 MG PO TABS: 4.0000 mg | ORAL_TABLET | Freq: Four times a day (QID) | ORAL | 0 refills | Status: DC

## 2016-03-02 MED ORDER — SODIUM CHLORIDE 0.9 % IV BOLUS (SEPSIS)
1000.0000 mL | INTRAVENOUS | Status: AC
  Administered 2016-03-02: 1000 mL via INTRAVENOUS

## 2016-03-02 MED ORDER — ACETAMINOPHEN 325 MG PO TABS
650.0000 mg | ORAL_TABLET | Freq: Once | ORAL | Status: AC
  Administered 2016-03-02: 650 mg via ORAL
  Filled 2016-03-02: qty 2

## 2016-03-02 MED ORDER — OSELTAMIVIR PHOSPHATE 75 MG PO CAPS: 75.0000 mg | ORAL_CAPSULE | Freq: Two times a day (BID) | ORAL | 0 refills | Status: DC

## 2016-03-02 MED ORDER — BENZONATATE 100 MG PO CAPS: 200.0000 mg | ORAL_CAPSULE | Freq: Two times a day (BID) | ORAL | 0 refills | Status: DC | PRN

## 2016-03-02 NOTE — ED Triage Notes (Signed)
C/o flu like s/s x 5 days-NAD-steady gait

## 2016-03-02 NOTE — ED Provider Notes (Signed)
[facility] Provider Note   CSN: [id number] Arrival date & time: 03/02/16  1143     History   Chief Complaint Chief Complaint  Patient presents with  . Cough    HPI Gary Bird is a 21 y.o. male.  Patient with past medical history of HIV, last CD4 count unknown, last viral load reportedly undetectable, compliant with antiviral medications, presents to the emergency department with chief complaint of flulike symptoms. He states the symptoms started about 5 days ago. He reports having cough, fever, sore throat, and chills, and body ache. He has not taken anything for his symptoms. There are no modifying or alleviating factors. He reports contacts. He denies any chest pain or shortness of breath.   The history is provided by the patient. No language interpreter was used.    Past Medical History:  Diagnosis Date  . ADHD (attention deficit hyperactivity disorder)   . Depression   . HIV positive (HCC)     There are no active problems to display for this patient.   Past Surgical History:  Procedure Laterality Date  . ADENOIDECTOMY W/ MYRINGOTOMY         Home Medications    Prior to Admission medications   Medication Sig Start Date End Date Taking? Authorizing Provider  Elviteg-Cobic-Emtricit-TenofAF (GENVOYA PO) Take by mouth.   Yes Historical Provider, MD  benzonatate (TESSALON) 100 MG capsule Take 2 capsules (200 mg total) by mouth 2 (two) times daily as needed for cough. 03/02/16   Roxy Horseman, PA-C  ondansetron (ZOFRAN) 4 MG tablet Take 1 tablet (4 mg total) by mouth every 6 (six) hours. 03/02/16   Roxy Horseman, PA-C  oseltamivir (TAMIFLU) 75 MG capsule Take 1 capsule (75 mg total) by mouth every 12 (twelve) hours. 03/02/16   Roxy Horseman, PA-C    Family History No family history on file.  Social History Social History  Substance Use Topics  . Smoking status: Current Every Day Smoker    Types: Cigarettes  . Smokeless tobacco: Never Used  .  Alcohol use Yes     Comment: occ     Allergies   Latex   Review of Systems Review of Systems  Constitutional: Positive for chills and fever.  HENT: Positive for postnasal drip, rhinorrhea, sinus pressure, sneezing and sore throat.   Respiratory: Positive for cough. Negative for shortness of breath.   Cardiovascular: Negative for chest pain.  Gastrointestinal: Negative for abdominal pain, constipation, diarrhea, nausea and vomiting.  Genitourinary: Negative for dysuria.     Physical Exam Updated Vital Signs BP 134/81 (BP Location: Left Arm)   Pulse 87   Temp 99.6 F (37.6 C) (Oral)   Resp 18   Ht 5\' 5"  (1.651 m)   Wt 64.9 kg   SpO2 99%   BMI 23.80 kg/m   Physical Exam Nursing note and vitals reviewed.  Constitutional: Appears well-developed and well-nourished. No distress.  HENT: Oropharynx is mildly erythematous, no tonsillar exudates or abscess, airway intact. Eyes: Conjunctivae are normal. Pupils are equal, round, and reactive to light.  Neck: Normal range of motion and full passive range of motion without pain.  Cardiovascular: normal rate and ryhthym and intact distal pulses.   Pulmonary/Chest: Effort normal and breath sounds normal. No stridor. No wheezes, rhonchi, or rales. Abdominal: Soft. There is no focal abdominal tenderness.  Musculoskeletal: Normal range of motion. Moves all extremities. Neurological: Pt is alert and oriented to person, place, and time. Sensation and strength grossly intact throughout. Skin:  Skin is warm and dry. No rash noted. Pt is not diaphoretic.  Psychiatric: Normal mood and affect.    ED Treatments / Results  Labs (all labs ordered are listed, but only abnormal results are displayed) Labs Reviewed - No data to display  EKG  EKG Interpretation None       Radiology Dg Chest 2 View  Result Date: 03/02/2016 CLINICAL DATA:  Cough for 4 days and shortness of breath EXAM: CHEST  2 VIEW COMPARISON:  July 01, 2013 FINDINGS: There  is no edema or consolidation. Heart size and pulmonary vascularity normal. No adenopathy. There is upper thoracic levoscoliosis. IMPRESSION: No edema or consolidation. Electronically Signed   By: Bretta BangWilliam  Woodruff III M.D.   On: 03/02/2016 12:37    Procedures Procedures (including critical care time)  Medications Ordered in ED Medications  acetaminophen (TYLENOL) tablet 650 mg (650 mg Oral Given 03/02/16 1208)  sodium chloride 0.9 % bolus 1,000 mL (0 mLs Intravenous Stopped 03/02/16 1359)     Initial Impression / Assessment and Plan / ED Course  I have reviewed the triage vital signs and the nursing notes.  Pertinent labs & imaging results that were available during my care of the patient were reviewed by me and considered in my medical decision making (see chart for details).     Patient with flu symptoms.  Onset was 5 days ago, but patient has HIV, will still give Tamiflu.  Last CD4 count unknown, but patient reports viral load as undetectable.  He is compliant with his HIV therapy.  Close return precautions/follow-up advised.    Final Clinical Impressions(s) / ED Diagnoses   Final diagnoses:  Influenza-like illness    New Prescriptions New Prescriptions   BENZONATATE (TESSALON) 100 MG CAPSULE    Take 2 capsules (200 mg total) by mouth 2 (two) times daily as needed for cough.   ONDANSETRON (ZOFRAN) 4 MG TABLET    Take 1 tablet (4 mg total) by mouth every 6 (six) hours.   OSELTAMIVIR (TAMIFLU) 75 MG CAPSULE    Take 1 capsule (75 mg total) by mouth every 12 (twelve) hours.     Roxy Horsemanobert Hart Haas, PA-C 03/02/16 1405    Lavera Guiseana Duo Liu, MD 03/02/16 [phone]

## 2016-03-02 NOTE — ED Notes (Signed)
Patient transported to X-ray 

## 2016-03-02 NOTE — ED Notes (Signed)
Pt directed to pharmacy to pick up Rx 

## 2016-03-02 NOTE — ED Notes (Signed)
ED Provider at bedside. 

## 2016-04-27 IMAGING — CT CT HEAD W/O CM
1 series · 16 of 30 positions shown, 20 images · non-contrast
Comparison: None.

CLINICAL DATA: Constant headache.

EXAM:
CT HEAD WITHOUT CONTRAST
TECHNIQUE: Contiguous axial images were obtained from the base of the skull
through the vertex without intravenous contrast.

[Series 2: head 4.8 h37s · axial · 0.39mm/px · z∈[-178,-45]mm · 16 of 32 slices shown, 20 images]
[im 2/32  brain]
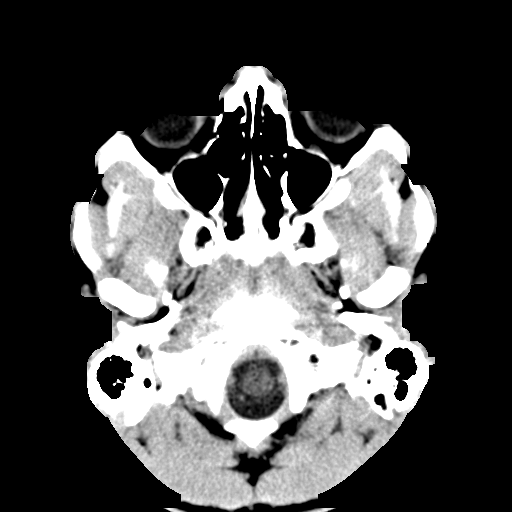
[im 2/32  bone]
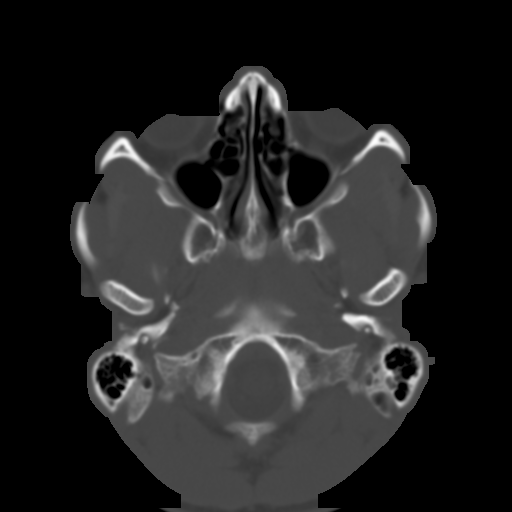
[im 4/32  brain]
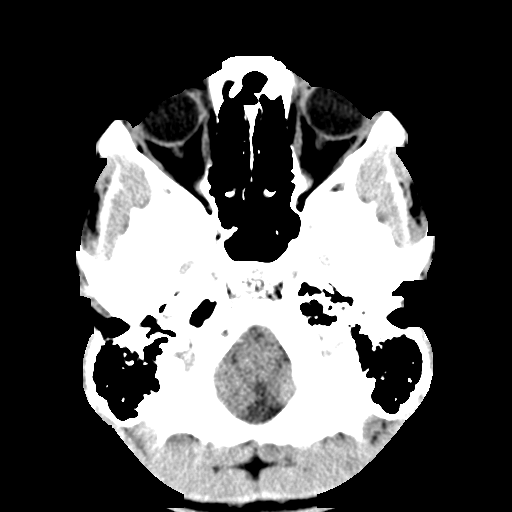
[im 6/32  brain]
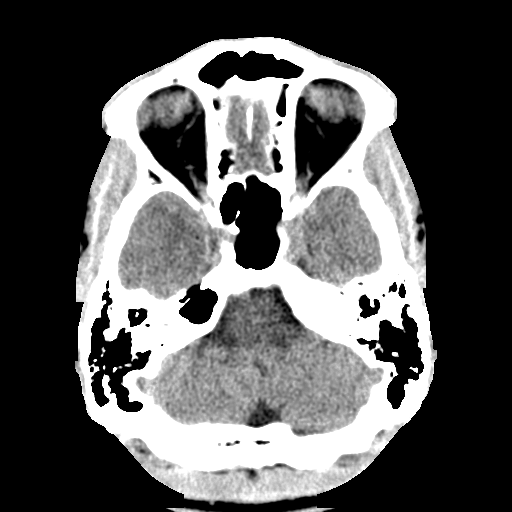
[im 8/32  brain]
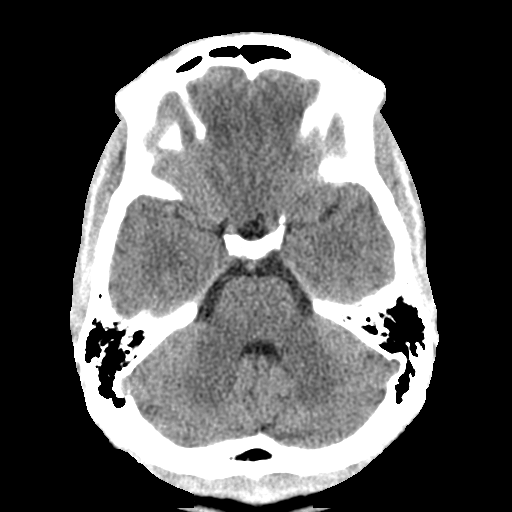
[im 9/32  brain]
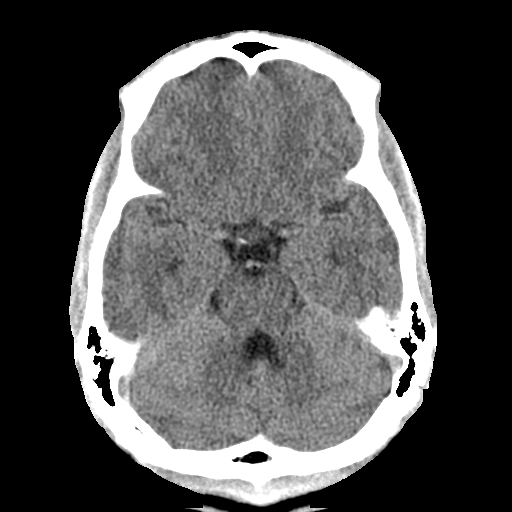
[im 9/32  bone]
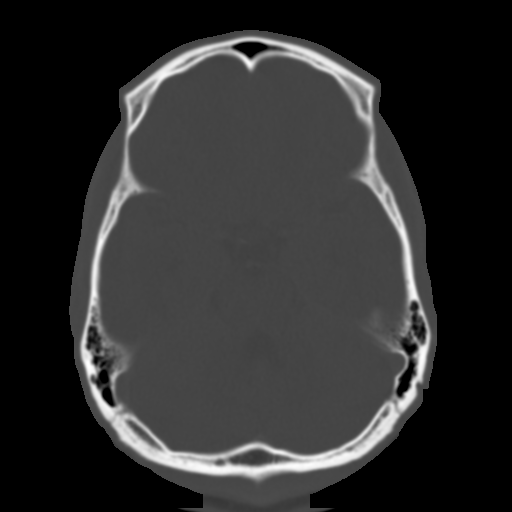
[im 11/32  brain]
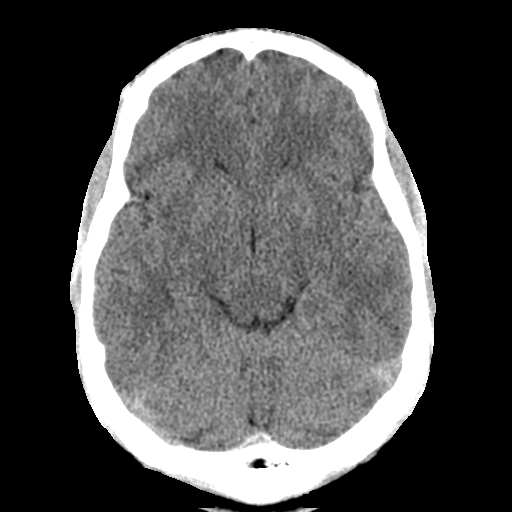
[im 13/32  brain]
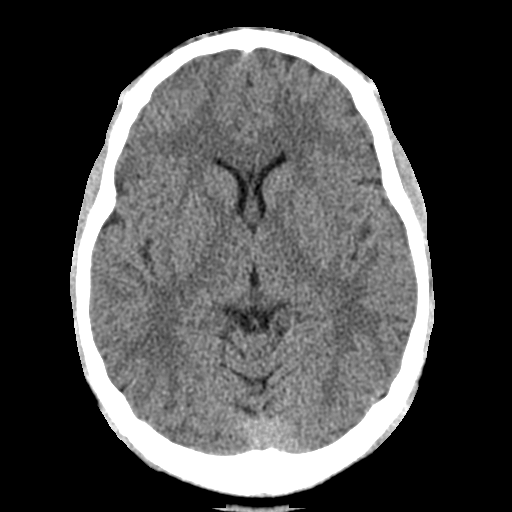
[im 15/32  brain]
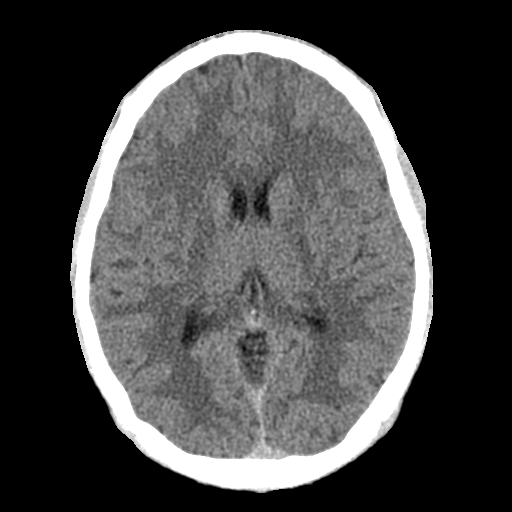
[im 17/32  brain]
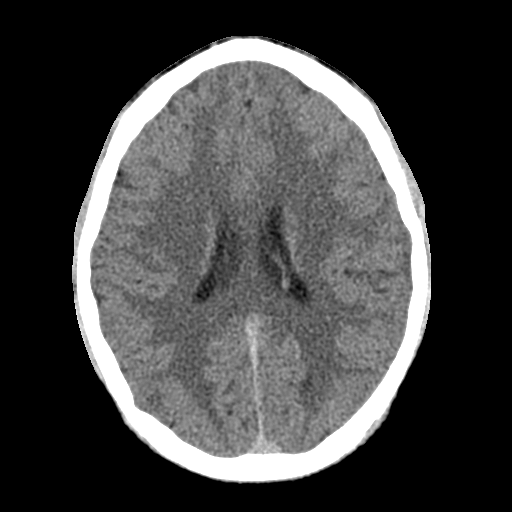
[im 17/32  bone]
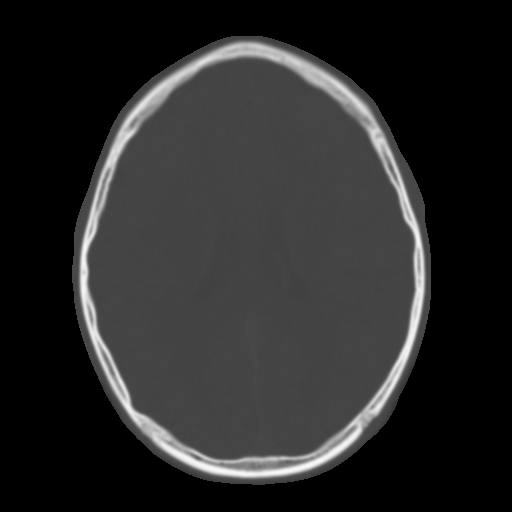
[im 19/32  brain]
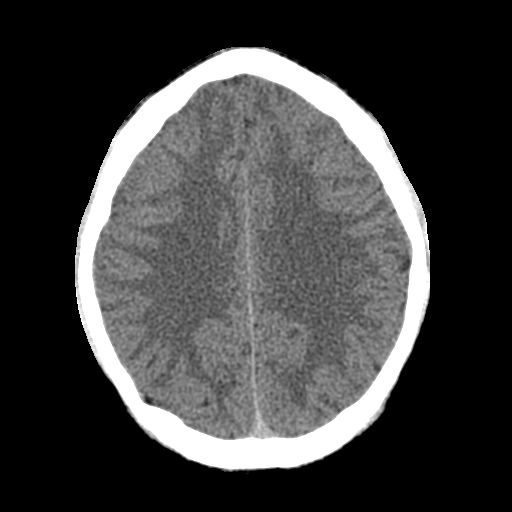
[im 21/32  brain]
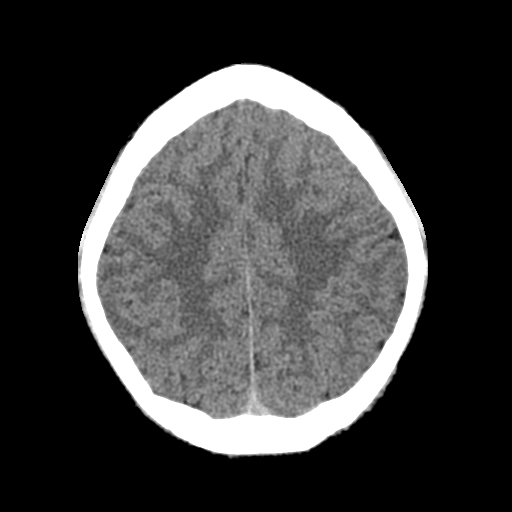
[im 23/32  brain]
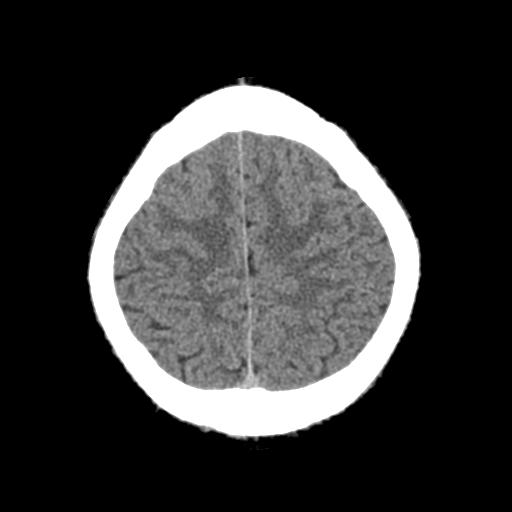
[im 24/32  brain]
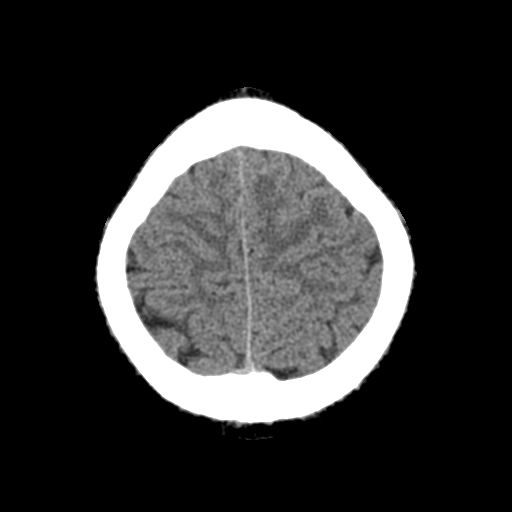
[im 24/32  bone]
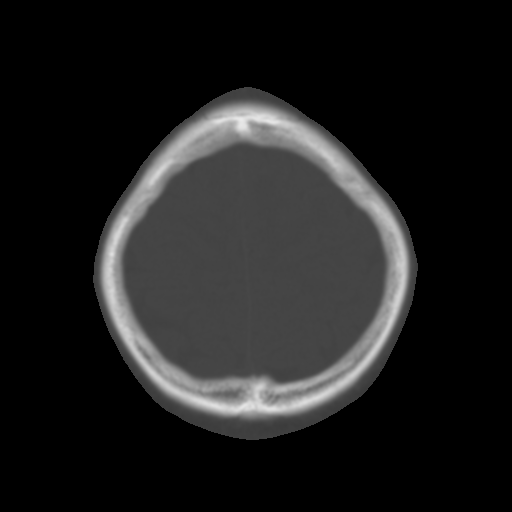
[im 26/32  brain]
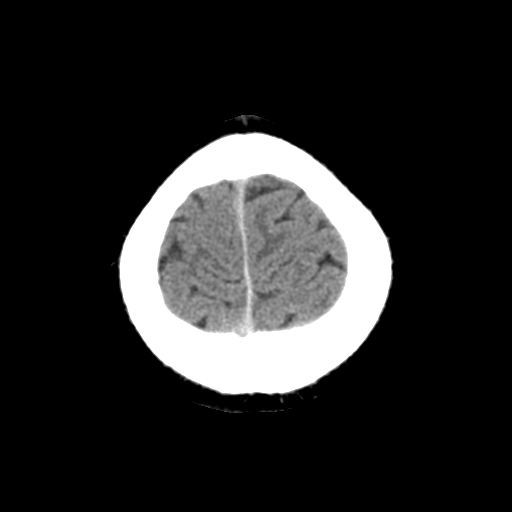
[im 28/32  brain]
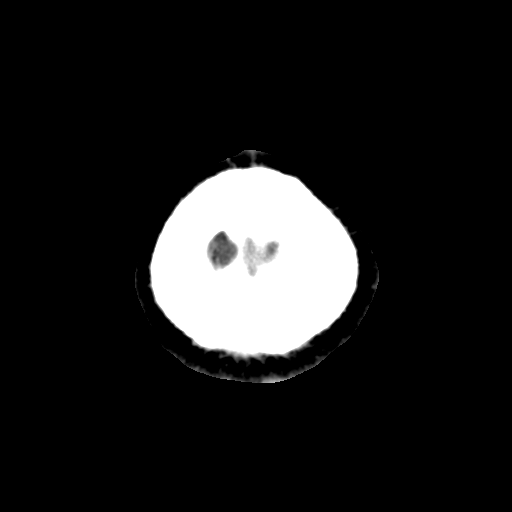
[im 30/32  brain]
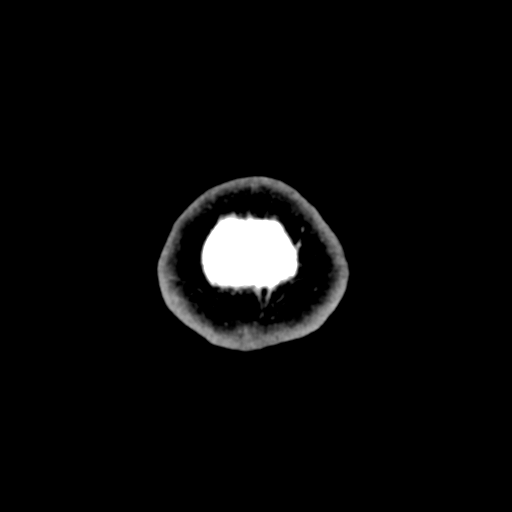

[16 of 30 positions shown; findings below may reference images not displayed]

FINDINGS: No evidence of an acute infarct, acute hemorrhage, mass lesion, mass
effect or hydrocephalus. Visualized portions of the paranasal
sinuses and mastoid air cells are clear.
IMPRESSION: Negative.

## 2016-07-11 ENCOUNTER — Encounter (HOSPITAL_BASED_OUTPATIENT_CLINIC_OR_DEPARTMENT_OTHER): Payer: Self-pay | Admitting: *Deleted

## 2016-07-11 DIAGNOSIS — Z5321 Procedure and treatment not carried out due to patient leaving prior to being seen by health care provider: Secondary | ICD-10-CM | POA: Diagnosis not present

## 2016-07-11 DIAGNOSIS — R070 Pain in throat: Secondary | ICD-10-CM | POA: Diagnosis present

## 2016-07-11 LAB — RAPID STREP SCREEN (MED CTR MEBANE ONLY): Streptococcus, Group A Screen (Direct): NEGATIVE

## 2016-07-11 MED ORDER — ACETAMINOPHEN 325 MG PO TABS
650.0000 mg | ORAL_TABLET | Freq: Once | ORAL | Status: AC
  Administered 2016-07-12: 650 mg via ORAL
  Filled 2016-07-11: qty 2

## 2016-07-11 NOTE — ED Triage Notes (Signed)
Ear pain and sore throat. States his mucus is yellow. Swollen nodes. He was told by friends he probable has a sinus infection.

## 2016-07-12 ENCOUNTER — Emergency Department (HOSPITAL_BASED_OUTPATIENT_CLINIC_OR_DEPARTMENT_OTHER)
Admission: EM | Admit: 2016-07-12 | Discharge: 2016-07-12 | Disposition: A | Discharge status: Home / Self Care | Payer: BLUE CROSS/BLUE SHIELD | Attending: Emergency Medicine | Admitting: Emergency Medicine

## 2016-07-12 NOTE — ED Notes (Signed)
Informed to registration that patient has left.

## 2016-07-12 NOTE — ED Notes (Signed)
Pt called for triage RN to lobby. RN spoke with pt in triage room regarding his concern for wait time. Pt also concerned that he has not been given pain medication. Pt requesting Tylenol, Tylenol ordered and given. Wait times explained to patient, and patient returned to waiting room.

## 2016-07-14 LAB — CULTURE, GROUP A STREP (THRC)

## 2017-07-26 ENCOUNTER — Emergency Department (HOSPITAL_BASED_OUTPATIENT_CLINIC_OR_DEPARTMENT_OTHER)
Admission: EM | Admit: 2017-07-26 | Discharge: 2017-07-26 | Disposition: A | Discharge status: Home / Self Care | Payer: BLUE CROSS/BLUE SHIELD | Attending: Emergency Medicine | Admitting: Emergency Medicine

## 2017-07-26 ENCOUNTER — Encounter (HOSPITAL_BASED_OUTPATIENT_CLINIC_OR_DEPARTMENT_OTHER): Payer: Self-pay | Admitting: Emergency Medicine

## 2017-07-26 ENCOUNTER — Other Ambulatory Visit: Payer: Self-pay

## 2017-07-26 DIAGNOSIS — B20 Human immunodeficiency virus [HIV] disease: Secondary | ICD-10-CM | POA: Insufficient documentation

## 2017-07-26 DIAGNOSIS — Z91040 Latex allergy status: Secondary | ICD-10-CM | POA: Insufficient documentation

## 2017-07-26 DIAGNOSIS — F17210 Nicotine dependence, cigarettes, uncomplicated: Secondary | ICD-10-CM | POA: Insufficient documentation

## 2017-07-26 DIAGNOSIS — R21 Rash and other nonspecific skin eruption: Secondary | ICD-10-CM | POA: Insufficient documentation

## 2017-07-26 DIAGNOSIS — Z79899 Other long term (current) drug therapy: Secondary | ICD-10-CM | POA: Insufficient documentation

## 2017-07-26 DIAGNOSIS — Z202 Contact with and (suspected) exposure to infections with a predominantly sexual mode of transmission: Secondary | ICD-10-CM | POA: Insufficient documentation

## 2017-07-26 LAB — URINALYSIS, ROUTINE W REFLEX MICROSCOPIC
Glucose, UA: NEGATIVE mg/dL
Hgb urine dipstick: NEGATIVE
KETONES UR: NEGATIVE mg/dL
LEUKOCYTES UA: NEGATIVE
NITRITE: NEGATIVE
PROTEIN: NEGATIVE mg/dL
Specific Gravity, Urine: 1.025 (ref 1.005–1.030)
pH: 6 (ref 5.0–8.0)

## 2017-07-26 MED ORDER — PENICILLIN G BENZATHINE 1200000 UNIT/2ML IM SUSP
2.4000 10*6.[IU] | Freq: Once | INTRAMUSCULAR | Status: AC
  Administered 2017-07-26: 2.4 10*6.[IU] via INTRAMUSCULAR
  Filled 2017-07-26: qty 4

## 2017-07-26 NOTE — ED Provider Notes (Signed)
[facility] EMERGENCY DEPARTMENT Provider Note   CSN: [id number] Arrival date & time: 07/26/17  1305     History   Chief Complaint Chief Complaint  Patient presents with  . Exposure to STD    HPI Gary Bird is a 22 y.o. male.  The history is provided by the patient. No language interpreter was used.  Exposure to STD    Gary Bird is a 22 y.o. male who presents to the Emergency Department complaining of rash. He presents to the emergency department for evaluation of two days of rash on his palms and soles. It is not an itchy. He also notes that about two days prior to rash development he did notice some lesions on his penis. He states he was recently sexually active, receiving oral intercourse. He does have a history of HIV and is compliant with his medications. He states he has a remote history of syphilis but had no symptoms at that time. He denies any dysuria or penile discharge. Denies any fevers, cough, vomiting, diarrhea. Symptoms are mild and constant in nature. Past Medical History:  Diagnosis Date  . ADHD (attention deficit hyperactivity disorder)   . Depression   . HIV positive (HCC)     There are no active problems to display for this patient.   Past Surgical History:  Procedure Laterality Date  . ADENOIDECTOMY W/ MYRINGOTOMY          Home Medications    Prior to Admission medications   Medication Sig Start Date End Date Taking? Authorizing Provider  benzonatate (TESSALON) 100 MG capsule Take 2 capsules (200 mg total) by mouth 2 (two) times daily as needed for cough. 03/02/16   Roxy HorsemanBrowning, Robert, PA-C  Elviteg-Cobic-Emtricit-TenofAF (GENVOYA PO) Take by mouth.    [provider]  ondansetron (ZOFRAN) 4 MG tablet Take 1 tablet (4 mg total) by mouth every 6 (six) hours. 03/02/16   Roxy HorsemanBrowning, Robert, PA-C  oseltamivir (TAMIFLU) 75 MG capsule Take 1 capsule (75 mg total) by mouth every 12 (twelve) hours. 03/02/16   Roxy HorsemanBrowning, Robert, PA-C     Family History History reviewed. No pertinent family history.  Social History Social History   Tobacco Use  . Smoking status: Current Every Day Smoker    Types: Cigarettes  . Smokeless tobacco: Never Used  Substance Use Topics  . Alcohol use: Yes    Comment: occ  . Drug use: No     Allergies   Latex   Review of Systems Review of Systems  All other systems reviewed and are negative.    Physical Exam Updated Vital Signs BP 130/79   Pulse 69   Temp 98.4 F (36.9 C) (Oral)   Resp 18   SpO2 96%   Physical Exam  Constitutional: He appears well-developed and well-nourished. No distress.  HENT:  Head: Normocephalic and atraumatic.  Neck: Neck supple.  Cardiovascular: Normal rate and regular rhythm.  No murmur heard. Pulmonary/Chest: Effort normal and breath sounds normal. No respiratory distress.  Abdominal: Soft. There is no tenderness. There is no guarding.  Genitourinary:  Genitourinary Comments: No tenderness to patient throughout the penis or scrotum. There are multiple circular lesions approximately 5 mm in diameter with a slightly raised border that are flesh-colored. Lesions are nontender. Lesions are located on the shaft of the penis and on the top of the scrotum.  Musculoskeletal: Normal range of motion. He exhibits no edema or tenderness.  There are scattered flat lesions 1 to 2 lesions on  the palms of both hands as well as scattered lesions on the soles of bilateral feet. Macules are 5 mm to 1 cm in diameter without swelling or tenderness.  Nursing note and vitals reviewed.      ED Treatments / Results  Labs (all labs ordered are listed, but only abnormal results are displayed) Labs Reviewed  URINALYSIS, ROUTINE W REFLEX MICROSCOPIC - Abnormal; Notable for the following components:      Result Value   Color, Urine AMBER (*)    Bilirubin Urine SMALL (*)    All other components within normal limits  URINE CULTURE  RPR  GC/CHLAMYDIA PROBE AMP  ([place]) NOT AT [facility]    EKG None  Radiology No results found.  Procedures Procedures (including critical care time)  Medications Ordered in ED Medications  penicillin g benzathine (BICILLIN LA) 1200000 UNIT/2ML injection 2.4 Million Units (2.4 Million Units Intramuscular Given 07/26/17 1510)     Initial Impression / Assessment and Plan / ED Course  I have reviewed the triage vital signs and the nursing notes.  Pertinent labs & imaging results that were available during my care of the patient were reviewed by me and considered in my medical decision making (see chart for details).     Patient with history of HIV here for evaluation of painless lesions on his hands and feet as well as some lesions on his penis. Exam is concerning for syphilis, will treat with antibiotics and send off titers. No evidence of cellulitis, abscess, SJS. Discussed with patient home care, safe sexual practices, outpatient follow-up and return precautions.  Final Clinical Impressions(s) / ED Diagnoses   Final diagnoses:  Possible exposure to STD  Rash    ED Discharge Orders    None       Tilden Fossa, MD 07/26/17 1529

## 2017-07-26 NOTE — ED Triage Notes (Addendum)
Reports rash to feet and hands x 3 days.  States he believes he was exposed to a syphillis through oral sex.  Denies any other symptoms

## 2017-07-27 LAB — URINE CULTURE: Culture: NO GROWTH

## 2017-07-28 LAB — RPR, QUANT+TP ABS (REFLEX): TREPONEMA PALLIDUM AB: POSITIVE — AB

## 2017-07-28 LAB — GC/CHLAMYDIA PROBE AMP (~~LOC~~) NOT AT ARMC
CHLAMYDIA, DNA PROBE: NEGATIVE
Neisseria Gonorrhea: NEGATIVE

## 2017-07-28 LAB — RPR: RPR Ser Ql: REACTIVE — AB

## 2017-10-27 ENCOUNTER — Encounter (HOSPITAL_BASED_OUTPATIENT_CLINIC_OR_DEPARTMENT_OTHER): Payer: Self-pay | Admitting: *Deleted

## 2017-10-27 ENCOUNTER — Emergency Department (HOSPITAL_BASED_OUTPATIENT_CLINIC_OR_DEPARTMENT_OTHER)
Admission: EM | Admit: 2017-10-27 | Discharge: 2017-10-28 | Disposition: A | Discharge status: Home / Self Care | Payer: Medicaid Other | Attending: Emergency Medicine | Admitting: Emergency Medicine

## 2017-10-27 ENCOUNTER — Other Ambulatory Visit: Payer: Self-pay

## 2017-10-27 DIAGNOSIS — F17210 Nicotine dependence, cigarettes, uncomplicated: Secondary | ICD-10-CM | POA: Insufficient documentation

## 2017-10-27 DIAGNOSIS — N3090 Cystitis, unspecified without hematuria: Secondary | ICD-10-CM

## 2017-10-27 DIAGNOSIS — Z21 Asymptomatic human immunodeficiency virus [HIV] infection status: Secondary | ICD-10-CM | POA: Insufficient documentation

## 2017-10-27 LAB — URINALYSIS, MICROSCOPIC (REFLEX): WBC, UA: >50 WBC/hpf (ref 0–5)

## 2017-10-27 LAB — URINALYSIS, ROUTINE W REFLEX MICROSCOPIC
BILIRUBIN URINE: NEGATIVE
Glucose, UA: NEGATIVE mg/dL
HGB URINE DIPSTICK: NEGATIVE
KETONES UR: NEGATIVE mg/dL
Nitrite: NEGATIVE
PH: 6 (ref 5.0–8.0)
Protein, ur: NEGATIVE mg/dL

## 2017-10-27 NOTE — ED Triage Notes (Signed)
Penile discharge. Back pain. Hematuria. States recent fatigue.

## 2017-10-27 NOTE — ED Notes (Signed)
C/o penile dc x 2 days  Had some blood in urine today  And back and abd pain off and on x 5 months

## 2017-10-28 MED ORDER — CEPHALEXIN 500 MG PO CAPS: 500.0000 mg | ORAL_CAPSULE | Freq: Three times a day (TID) | ORAL | 0 refills | Status: DC

## 2017-10-28 MED ORDER — CEPHALEXIN 250 MG PO CAPS
500.0000 mg | ORAL_CAPSULE | Freq: Once | ORAL | Status: AC
  Administered 2017-10-28: 500 mg via ORAL
  Filled 2017-10-28: qty 2

## 2017-10-28 NOTE — ED Provider Notes (Signed)
[facility] EMERGENCY DEPARTMENT Provider Note   CSN: [id number] Arrival date & time: 10/27/17  2306     History   Chief Complaint Chief Complaint  Patient presents with  . Penile Discharge  . Dysuria    HPI Gary Bird is a 22 y.o. male.  The history is provided by the patient.  Penile Discharge  This is a new problem. The current episode started 2 days ago. The problem occurs daily. The problem has been gradually worsening. Associated symptoms include abdominal pain. Exacerbated by: urination. Nothing relieves the symptoms.  Dysuria    Patient reports recent abdominal pain, mild back pain.  Over the past 2 days has had dysuria and penile discharge.  He also reports fatigue. He denies any recent sexual activity in the past several months.  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  He reports he is fully compliant with his Genvoya for HIV Past Medical History:  Diagnosis Date  . ADHD (attention deficit hyperactivity disorder)   . Depression   . HIV positive (HCC)     There are no active problems to display for this patient.   Past Surgical History:  Procedure Laterality Date  . ADENOIDECTOMY W/ MYRINGOTOMY          Home Medications    Prior to Admission medications   Medication Sig Start Date End Date Taking? Authorizing Provider  benzonatate (TESSALON) 100 MG capsule Take 2 capsules (200 mg total) by mouth 2 (two) times daily as needed for cough. 03/02/16   Roxy Horseman, PA-C  cephALEXin (KEFLEX) 500 MG capsule Take 1 capsule (500 mg total) by mouth 3 (three) times daily. 10/28/17   Zadie Rhine, MD  Elviteg-Cobic-Emtricit-TenofAF (GENVOYA PO) Take by mouth.    [provider]  ondansetron (ZOFRAN) 4 MG tablet Take 1 tablet (4 mg total) by mouth every 6 (six) hours. 03/02/16   Roxy Horseman, PA-C  oseltamivir (TAMIFLU) 75 MG capsule Take 1 capsule  (75 mg total) by mouth every 12 (twelve) hours. 03/02/16   Roxy Horseman, PA-C    Family History No family history on file.  Social History Social History   Tobacco Use  . Smoking status: Current Every Day Smoker    Types: Cigarettes  . Smokeless tobacco: Never Used  Substance Use Topics  . Alcohol use: Yes    Comment: occ  . Drug use: No     Allergies   Latex   Review of Systems Review of Systems  Constitutional: Positive for fatigue.  Gastrointestinal: Positive for abdominal pain.  Genitourinary: Positive for discharge and dysuria.  All other systems reviewed and are negative.    Physical Exam Updated Vital Signs BP 125/67   Pulse (!) 53   Temp 98.1 F (36.7 C) (Oral)   Resp 20   Ht 1.651 m (5\' 5" )   Wt 65.8 kg   SpO2 100%   BMI 24.13 kg/m   Physical Exam CONSTITUTIONAL: Well developed/well nourished HEAD: Normocephalic/atraumatic EYES: EOMI/PERRL ENMT: Mucous membranes moist NECK: supple no meningeal signs CV: S1/S2 noted, no murmurs/rubs/gallops noted LUNGS: Lungs are clear to auscultation bilaterally, no apparent distress ABDOMEN: soft, nontender, no rebound or guarding, bowel sounds noted throughout abdomen GU:no cva tenderness, no penile lesions, no penile discharge, no scrotal tenderness or edema, no inguinal lymphadenopathy, nurse chaperone present for entire exam NEURO: Pt is awake/alert/appropriate, moves all extremitiesx4. EXTREMITIES: pulses normal/equal, full ROM SKIN: warm, color  normal PSYCH: no abnormalities of mood noted, alert and oriented to situation   ED Treatments / Results  Labs (all labs ordered are listed, but only abnormal results are displayed) Labs Reviewed  URINALYSIS, ROUTINE W REFLEX MICROSCOPIC - Abnormal; Notable for the following components:      Result Value   APPearance CLOUDY (*)    Specific Gravity, Urine >1.030 (*)    Leukocytes, UA MODERATE (*)    All other components within normal limits  URINALYSIS,  MICROSCOPIC (REFLEX) - Abnormal; Notable for the following components:   Bacteria, UA MANY (*)    All other components within normal limits  URINE CULTURE  GC/CHLAMYDIA PROBE AMP ([place]) NOT AT [facility]    EKG None  Radiology No results found.  Procedures Procedures (including critical care time)  Medications Ordered in ED Medications  cephALEXin (KEFLEX) capsule 500 mg (500 mg Oral Given 10/28/17 0023)     Initial Impression / Assessment and Plan / ED Course  I have reviewed the triage vital signs and the nursing notes.  Pertinent labs  results that were available during my care of the patient were reviewed by me and considered in my medical decision making (see chart for details).     At this time, will treat for complicated UTI.  Urine culture is sent.  GC/Chlamydia test are pending.  He specifically denies any recent sexual activity. Advised him to follow-up with his infectious disease doctor by the end of the month.  Final Clinical Impressions(s) / ED Diagnoses   Final diagnoses:  Cystitis    ED Discharge Orders         Ordered    cephALEXin (KEFLEX) 500 MG capsule  3 times daily     10/28/17 0014           Zadie Rhine, MD 10/28/17 0114

## 2017-10-29 LAB — URINE CULTURE: Culture: NO GROWTH

## 2017-10-30 LAB — GC/CHLAMYDIA PROBE AMP (~~LOC~~) NOT AT ARMC
CHLAMYDIA, DNA PROBE: NEGATIVE
NEISSERIA GONORRHEA: POSITIVE — AB

## 2018-02-16 ENCOUNTER — Encounter (HOSPITAL_BASED_OUTPATIENT_CLINIC_OR_DEPARTMENT_OTHER): Payer: Self-pay | Admitting: Emergency Medicine

## 2018-02-16 ENCOUNTER — Emergency Department (HOSPITAL_BASED_OUTPATIENT_CLINIC_OR_DEPARTMENT_OTHER)
Admission: EM | Admit: 2018-02-16 | Discharge: 2018-02-16 | Disposition: A | Discharge status: Home / Self Care | Payer: Medicaid Other | Attending: Emergency Medicine | Admitting: Emergency Medicine

## 2018-02-16 ENCOUNTER — Other Ambulatory Visit: Payer: Self-pay

## 2018-02-16 DIAGNOSIS — Z91040 Latex allergy status: Secondary | ICD-10-CM | POA: Insufficient documentation

## 2018-02-16 DIAGNOSIS — Z79899 Other long term (current) drug therapy: Secondary | ICD-10-CM | POA: Insufficient documentation

## 2018-02-16 DIAGNOSIS — R197 Diarrhea, unspecified: Secondary | ICD-10-CM | POA: Insufficient documentation

## 2018-02-16 DIAGNOSIS — Z8619 Personal history of other infectious and parasitic diseases: Secondary | ICD-10-CM | POA: Insufficient documentation

## 2018-02-16 DIAGNOSIS — R300 Dysuria: Secondary | ICD-10-CM

## 2018-02-16 DIAGNOSIS — R112 Nausea with vomiting, unspecified: Secondary | ICD-10-CM

## 2018-02-16 DIAGNOSIS — F909 Attention-deficit hyperactivity disorder, unspecified type: Secondary | ICD-10-CM | POA: Insufficient documentation

## 2018-02-16 DIAGNOSIS — F17210 Nicotine dependence, cigarettes, uncomplicated: Secondary | ICD-10-CM | POA: Insufficient documentation

## 2018-02-16 DIAGNOSIS — F329 Major depressive disorder, single episode, unspecified: Secondary | ICD-10-CM | POA: Insufficient documentation

## 2018-02-16 LAB — URINALYSIS, ROUTINE W REFLEX MICROSCOPIC
Glucose, UA: NEGATIVE mg/dL
Hgb urine dipstick: NEGATIVE
KETONES UR: 15 mg/dL — AB
Leukocytes, UA: NEGATIVE
NITRITE: NEGATIVE
PROTEIN: 30 mg/dL — AB
Specific Gravity, Urine: 1.01 (ref 1.005–1.030)
pH: 8 (ref 5.0–8.0)

## 2018-02-16 LAB — URINALYSIS, MICROSCOPIC (REFLEX)

## 2018-02-16 MED ORDER — METOCLOPRAMIDE HCL 10 MG PO TABS: 10.0000 mg | ORAL_TABLET | Freq: Four times a day (QID) | ORAL | 0 refills | Status: DC | PRN

## 2018-02-16 MED ORDER — AZITHROMYCIN 250 MG PO TABS
1000.0000 mg | ORAL_TABLET | Freq: Once | ORAL | Status: AC
  Administered 2018-02-16: 1000 mg via ORAL
  Filled 2018-02-16: qty 4

## 2018-02-16 MED ORDER — CEFTRIAXONE SODIUM 250 MG IJ SOLR
250.0000 mg | Freq: Once | INTRAMUSCULAR | Status: AC
  Administered 2018-02-16: 250 mg via INTRAMUSCULAR
  Filled 2018-02-16: qty 250

## 2018-02-16 MED ORDER — METOCLOPRAMIDE HCL 10 MG PO TABS
10.0000 mg | ORAL_TABLET | Freq: Once | ORAL | Status: AC
  Administered 2018-02-16: 10 mg via ORAL
  Filled 2018-02-16: qty 1

## 2018-02-16 NOTE — ED Provider Notes (Signed)
[facility] Provider Note: Gary Dell, MD, FACEP  CSN: [id number] MRN: [id number] ARRIVAL: 02/16/18 at 0058 ROOM: MH07/[facility]07   CHIEF COMPLAINT  Dysuria   HISTORY OF PRESENT ILLNESS  02/16/18 2:19 AM Gary Bird is a 23 y.o. male with a history of HIV on Genvoya.  He was seen in October of last year for dysuria and was treated with Keflex.  GC chlamydia testing was positive for Neisseria gonorrheae but he was lost to follow-up due to a change in phone number.  He returns with a 4-day history of burning with urination which he describes as "really bad".  He denies urethral discharge.  Yesterday he developed nausea, vomiting and diarrhea with associated abdominal cramping.    Past Medical History:  Diagnosis Date  . ADHD (attention deficit hyperactivity disorder)   . Depression   . HIV positive (HCC)     Past Surgical History:  Procedure Laterality Date  . ADENOIDECTOMY W/ MYRINGOTOMY      History reviewed. No pertinent family history.  Social History   Tobacco Use  . Smoking status: Current Every Day Smoker    Types: Cigarettes  . Smokeless tobacco: Never Used  Substance Use Topics  . Alcohol use: Yes    Comment: occ  . Drug use: No    Prior to Admission medications   Medication Sig Start Date End Date Taking? Authorizing Provider  cephALEXin (KEFLEX) 500 MG capsule Take 1 capsule (500 mg total) by mouth 3 (three) times daily. 10/28/17   Zadie Rhine, MD  Elviteg-Cobic-Emtricit-TenofAF (GENVOYA PO) Take by mouth.    [provider]    Allergies Latex   REVIEW OF SYSTEMS  Negative except as noted here or in the History of Present Illness.   PHYSICAL EXAMINATION  Initial Vital Signs Blood pressure 126/86, pulse (!) 57, temperature 98.7 F (37.1 C), temperature source Oral, resp. rate 16, height 5\' 5"  (1.651 m), weight 65.8 kg, SpO2 100 %.  Examination General: Well-developed, well-nourished male in no acute distress;  appearance consistent with age of record HENT: normocephalic; atraumatic Eyes: pupils equal, round and reactive to light; extraocular muscles intact Neck: supple Heart: regular rate and rhythm Lungs: clear to auscultation bilaterally Abdomen: soft; nondistended; nontender; no masses or hepatosplenomegaly; bowel sounds present GU: Tanner V male, circumcised; no urethral discharge Extremities: No deformity; full range of motion Neurologic: Awake, alert and oriented; motor function intact in all extremities and symmetric; no facial droop Skin: Warm and dry Psychiatric: Normal mood and affect   RESULTS  Summary of this visit's results, reviewed by myself:   EKG Interpretation  Date/Time:    Ventricular Rate:    PR Interval:    QRS Duration:   QT Interval:    QTC Calculation:   R Axis:     Text Interpretation:        Laboratory Studies: Results for orders placed or performed during the hospital encounter of 02/16/18 (from the past 24 hour(s))  Urinalysis, Routine w reflex microscopic- may I&O cath if menses     Status: Abnormal   Collection Time: 02/16/18  1:10 AM  Result Value Ref Range   Color, Urine YELLOW YELLOW   APPearance CLEAR CLEAR   Specific Gravity, Urine 1.010 1.005 - 1.030   pH 8.0 5.0 - 8.0   Glucose, UA NEGATIVE NEGATIVE mg/dL   Hgb urine dipstick NEGATIVE NEGATIVE   Bilirubin Urine SMALL (A) NEGATIVE   Ketones, ur 15 (A) NEGATIVE mg/dL   Protein, ur 30 (  A) NEGATIVE mg/dL   Nitrite NEGATIVE NEGATIVE   Leukocytes, UA NEGATIVE NEGATIVE  Urinalysis, Microscopic (reflex)     Status: Abnormal   Collection Time: 02/16/18  1:10 AM  Result Value Ref Range   RBC / HPF 0-5 0 - 5 RBC/hpf   WBC, UA 0-5 0 - 5 WBC/hpf   Bacteria, UA RARE (A) NONE SEEN   Squamous Epithelial / LPF 0-5 0 - 5   Imaging Studies: No results found.  ED COURSE and MDM  Nursing notes and initial vitals signs, including pulse oximetry, reviewed.  Vitals:   02/16/18 0104 02/16/18 0105    BP:  126/86  Pulse:  (!) 57  Resp:  16  Temp:  98.7 F (37.1 C)  TempSrc:  Oral  SpO2:  100%  Weight: 65.8 kg   Height: 5\' 5"  (1.651 m)    We will treat for gonorrhea and chlamydia.  Urine sent for GC/chlamydia testing.  PROCEDURES    ED DIAGNOSES     ICD-10-CM   1. History of sexually transmitted disease Z86.19   2. Dysuria R30.0   3. Nausea, vomiting and diarrhea R11.2    R19.7        Wylie Russon, Jonny RuizJohn, MD 02/16/18 [phone]

## 2018-02-16 NOTE — ED Triage Notes (Addendum)
Pt is c/o dysuria and nausea  Pt states he was seen about a month ago for same  Pt added that he has had vomiting and diarrhea today along with abd cramping

## 2018-02-19 LAB — GC/CHLAMYDIA PROBE AMP (~~LOC~~) NOT AT ARMC
CHLAMYDIA, DNA PROBE: NEGATIVE
Neisseria Gonorrhea: NEGATIVE

## 2018-07-19 ENCOUNTER — Emergency Department (HOSPITAL_BASED_OUTPATIENT_CLINIC_OR_DEPARTMENT_OTHER): Payer: HRSA Program

## 2018-07-19 ENCOUNTER — Other Ambulatory Visit: Payer: Self-pay

## 2018-07-19 ENCOUNTER — Emergency Department (HOSPITAL_BASED_OUTPATIENT_CLINIC_OR_DEPARTMENT_OTHER)
Admission: EM | Admit: 2018-07-19 | Discharge: 2018-07-19 | Disposition: A | Discharge status: Home / Self Care | Payer: HRSA Program | Attending: Emergency Medicine | Admitting: Emergency Medicine

## 2018-07-19 ENCOUNTER — Encounter (HOSPITAL_BASED_OUTPATIENT_CLINIC_OR_DEPARTMENT_OTHER): Payer: Self-pay | Admitting: Emergency Medicine

## 2018-07-19 DIAGNOSIS — J111 Influenza due to unidentified influenza virus with other respiratory manifestations: Secondary | ICD-10-CM | POA: Diagnosis not present

## 2018-07-19 DIAGNOSIS — F17210 Nicotine dependence, cigarettes, uncomplicated: Secondary | ICD-10-CM | POA: Insufficient documentation

## 2018-07-19 DIAGNOSIS — R509 Fever, unspecified: Secondary | ICD-10-CM | POA: Diagnosis present

## 2018-07-19 DIAGNOSIS — Z21 Asymptomatic human immunodeficiency virus [HIV] infection status: Secondary | ICD-10-CM | POA: Diagnosis not present

## 2018-07-19 DIAGNOSIS — Z91040 Latex allergy status: Secondary | ICD-10-CM | POA: Diagnosis not present

## 2018-07-19 DIAGNOSIS — Z20828 Contact with and (suspected) exposure to other viral communicable diseases: Secondary | ICD-10-CM | POA: Insufficient documentation

## 2018-07-19 DIAGNOSIS — R6889 Other general symptoms and signs: Secondary | ICD-10-CM

## 2018-07-19 LAB — COMPREHENSIVE METABOLIC PANEL
ALT: 13 U/L (ref 0–44)
AST: 25 U/L (ref 15–41)
Albumin: 4.1 g/dL (ref 3.5–5.0)
Alkaline Phosphatase: 37 U/L — ABNORMAL LOW (ref 38–126)
Anion gap: 10 (ref 5–15)
BUN: 13 mg/dL (ref 6–20)
CO2: 25 mmol/L (ref 22–32)
Calcium: 9 mg/dL (ref 8.9–10.3)
Chloride: 100 mmol/L (ref 98–111)
Creatinine, Ser: 1.25 mg/dL — ABNORMAL HIGH (ref 0.61–1.24)
GFR calc Af Amer: >60 mL/min (ref >60)
GFR calc non Af Amer: >60 mL/min (ref >60)
Glucose, Bld: 97 mg/dL (ref 70–99)
Potassium: 3.6 mmol/L (ref 3.5–5.1)
Sodium: 135 mmol/L (ref 135–145)
Total Bilirubin: 0.8 mg/dL (ref 0.3–1.2)
Total Protein: 8.3 g/dL — ABNORMAL HIGH (ref 6.5–8.1)

## 2018-07-19 LAB — CBC WITH DIFFERENTIAL/PLATELET
Abs Immature Granulocytes: 0.03 10*3/uL (ref 0.00–0.07)
Basophils Absolute: 0 10*3/uL (ref 0.0–0.1)
Basophils Relative: 0 %
Eosinophils Absolute: 0 10*3/uL (ref 0.0–0.5)
Eosinophils Relative: 0 %
HCT: 39.4 % (ref 39.0–52.0)
Hemoglobin: 13 g/dL (ref 13.0–17.0)
Immature Granulocytes: 1 %
Lymphocytes Relative: 21 %
Lymphs Abs: 1.4 10*3/uL (ref 0.7–4.0)
MCH: 30.2 pg (ref 26.0–34.0)
MCHC: 33 g/dL (ref 30.0–36.0)
MCV: 91.6 fL (ref 80.0–100.0)
Monocytes Absolute: 0.7 10*3/uL (ref 0.1–1.0)
Monocytes Relative: 10 %
Neutro Abs: 4.5 10*3/uL (ref 1.7–7.7)
Neutrophils Relative %: 68 %
Platelets: 144 10*3/uL — ABNORMAL LOW (ref 150–400)
RBC: 4.3 MIL/uL (ref 4.22–5.81)
RDW: 11.6 % (ref 11.5–15.5)
WBC: 6.5 10*3/uL (ref 4.0–10.5)
nRBC: 0 % (ref 0.0–0.2)

## 2018-07-19 MED ORDER — ACETAMINOPHEN 500 MG PO TABS
1000.0000 mg | ORAL_TABLET | Freq: Once | ORAL | Status: AC
  Administered 2018-07-19: 1000 mg via ORAL
  Filled 2018-07-19: qty 2

## 2018-07-19 NOTE — Discharge Instructions (Signed)
Person Under Monitoring Name: Gary Bird  Location: [address] [place] [address]   Infection Prevention Recommendations for Individuals Confirmed to have, or Being Evaluated for, 2019 Novel Coronavirus (COVID-19) Infection Who Receive Care at Home  Individuals who are confirmed to have, or are being evaluated for, COVID-19 should follow the prevention steps below until a healthcare provider or local or state health department says they can return to normal activities.  Stay home except to get medical care You should restrict activities outside your home, except for getting medical care. Do not go to work, school, or public areas, and do not use public transportation or taxis.  Call ahead before visiting your doctor Before your medical appointment, call the healthcare provider and tell them that you have, or are being evaluated for, COVID-19 infection. This will help the healthcare providers office take steps to keep other people from getting infected. Ask your healthcare provider to call the local or state health department.  Monitor your symptoms Seek prompt medical attention if your illness is worsening (e.g., difficulty breathing). Before going to your medical appointment, call the healthcare provider and tell them that you have, or are being evaluated for, COVID-19 infection. Ask your healthcare provider to call the local or state health department.  Wear a facemask You should wear a facemask that covers your nose and mouth when you are in the same room with other people and when you visit a healthcare provider. People who live with or visit you should also wear a facemask while they are in the same room with you.  Separate yourself from other people in your home As much as possible, you should stay in a different room from other people in your home. Also, you should use a separate bathroom, if available.  Avoid sharing household items You should  not share dishes, drinking glasses, cups, eating utensils, towels, bedding, or other items with other people in your home. After using these items, you should wash them thoroughly with soap and water.  Cover your coughs and sneezes Cover your mouth and nose with a tissue when you cough or sneeze, or you can cough or sneeze into your sleeve. Throw used tissues in a lined trash can, and immediately wash your hands with soap and water for at least 20 seconds or use an alcohol-based hand rub.  Wash your [facility] your hands often and thoroughly with soap and water for at least 20 seconds. You can use an alcohol-based hand sanitizer if soap and water are not available and if your hands are not visibly dirty. Avoid touching your eyes, nose, and mouth with unwashed hands.   Prevention Steps for Caregivers and Household Members of Individuals Confirmed to have, or Being Evaluated for, COVID-19 Infection Being Cared for in the Home  If you live with, or provide care at home for, a person confirmed to have, or being evaluated for, COVID-19 infection please follow these guidelines to prevent infection:  Follow healthcare providers instructions Make sure that you understand and can help the patient follow any healthcare provider instructions for all care.  Provide for the patients basic needs You should help the patient with basic needs in the home and provide support for getting groceries, prescriptions, and other personal needs.  Monitor the patients symptoms If they are getting sicker, call his or her medical provider and tell them that the patient has, or is being evaluated for, COVID-19 infection. This will help the  healthcare providers office take steps to keep other people from getting infected. Ask the healthcare provider to call the local or state health department.  Limit the number of people who have contact with the patient If possible, have only one caregiver for the  patient. Other household members should stay in another home or place of residence. If this is not possible, they should stay in another room, or be separated from the patient as much as possible. Use a separate bathroom, if available. Restrict visitors who do not have an essential need to be in the home.  Keep older adults, very young children, and other sick people away from the patient Keep older adults, very young children, and those who have compromised immune systems or chronic health conditions away from the patient. This includes people with chronic heart, lung, or kidney conditions, diabetes, and cancer.  Ensure good ventilation Make sure that shared spaces in the home have good air flow, such as from an air conditioner or an opened window, weather permitting.  Wash your hands often Wash your hands often and thoroughly with soap and water for at least 20 seconds. You can use an alcohol based hand sanitizer if soap and water are not available and if your hands are not visibly dirty. Avoid touching your eyes, nose, and mouth with unwashed hands. Use disposable paper towels to dry your hands. If not available, use dedicated cloth towels and replace them when they become wet.  Wear a facemask and gloves Wear a disposable facemask at all times in the room and gloves when you touch or have contact with the patients blood, body fluids, and/or secretions or excretions, such as sweat, saliva, sputum, nasal mucus, vomit, urine, or feces.  Ensure the mask fits over your nose and mouth tightly, and do not touch it during use. Throw out disposable facemasks and gloves after using them. Do not reuse. Wash your hands immediately after removing your facemask and gloves. If your personal clothing becomes contaminated, carefully remove clothing and launder. Wash your hands after handling contaminated clothing. Place all used disposable facemasks, gloves, and other waste in a lined container before  disposing them with other household waste. Remove gloves and wash your hands immediately after handling these items.  Do not share dishes, glasses, or other household items with the patient Avoid sharing household items. You should not share dishes, drinking glasses, cups, eating utensils, towels, bedding, or other items with a patient who is confirmed to have, or being evaluated for, COVID-19 infection. After the person uses these items, you should wash them thoroughly with soap and water.  Wash laundry thoroughly Immediately remove and wash clothes or bedding that have blood, body fluids, and/or secretions or excretions, such as sweat, saliva, sputum, nasal mucus, vomit, urine, or feces, on them. Wear gloves when handling laundry from the patient. Read and follow directions on labels of laundry or clothing items and detergent. In general, wash and dry with the warmest temperatures recommended on the label.  Clean all areas the individual has used often Clean all touchable surfaces, such as counters, tabletops, doorknobs, bathroom fixtures, toilets, phones, keyboards, tablets, and bedside tables, every day. Also, clean any surfaces that may have blood, body fluids, and/or secretions or excretions on them. Wear gloves when cleaning surfaces the patient has come in contact with. Use a diluted bleach solution (e.g., dilute bleach with 1 part bleach and 10 parts water) or a household disinfectant with a label that says EPA-registered for coronaviruses. To  make a bleach solution at home, add 1 tablespoon of bleach to 1 quart (4 cups) of water. For a larger supply, add  cup of bleach to 1 gallon (16 cups) of water. Read labels of cleaning products and follow recommendations provided on product labels. Labels contain instructions for safe and effective use of the cleaning product including precautions you should take when applying the product, such as wearing gloves or eye protection and making sure you  have good ventilation during use of the product. Remove gloves and wash hands immediately after cleaning.  Monitor yourself for signs and symptoms of illness Caregivers and household members are considered close contacts, should monitor their health, and will be asked to limit movement outside of the home to the extent possible. Follow the monitoring steps for close contacts listed on the symptom monitoring form.   ? If you have additional questions, contact your local health department or call the epidemiologist on call at [phone] (available 24/7). ? This guidance is subject to change. For the most up-to-date guidance from [facility], please refer to their website: [email or web address]

## 2018-07-19 NOTE — ED Provider Notes (Signed)
Emergency Department Provider Note   I have reviewed the triage vital signs and the nursing notes.   HISTORY  Chief Complaint Influenza   HPI Gary Bird is a 23 y.o. male with PMH of ADHD and HIV with unknown recent CD4 count presents to the emergency department for evaluation of fever, body aches, diarrhea over the past 2 days.  Patient states that he has not been taking his antiviral medications but has an appointment with his infectious disease doctor scheduled for next month.  Unsure what his last CD4 counts were.  He denies any respiratory symptoms such as cough, shortness of breath, sore throat.  He does have a mild headache but is mostly complaining of body aches and nonbloody diarrhea.  No abdominal pain.  Denies any vomiting.  No chest pain.  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  No known COVID 19 exposure.   Past Medical History:  Diagnosis Date  . ADHD (attention deficit hyperactivity disorder)   . Depression   . HIV positive (HCC)     There are no active problems to display for this patient.   Past Surgical History:  Procedure Laterality Date  . ADENOIDECTOMY W/ MYRINGOTOMY      Allergies Latex  No family history on file.  Social History Social History   Tobacco Use  . Smoking status: Current Every Day Smoker    Types: Cigarettes  . Smokeless tobacco: Never Used  Substance Use Topics  . Alcohol use: Yes    Comment: occ  . Drug use: No    Review of Systems  Constitutional: Positive fever/chills and body aches.  Eyes: No visual changes. ENT: No sore throat. Cardiovascular: Denies chest pain. Respiratory: Denies shortness of breath. Gastrointestinal: No abdominal pain.  No nausea, no vomiting. Positive diarrhea.  No constipation. Genitourinary: Negative for dysuria. Musculoskeletal: Negative for back pain. Skin: Negative for rash. Neurological: Negative for focal weakness or numbness. Positive HA.    10-point ROS otherwise negative.  ____________________________________________   PHYSICAL EXAM:  VITAL SIGNS: ED Triage Vitals  Enc Vitals Group     BP 07/19/18 0742 123/71     Pulse Rate 07/19/18 0742 89     Resp 07/19/18 0742 18     Temp 07/19/18 0742 (!) 101.4 F (38.6 C)     Temp Source 07/19/18 0742 Oral     SpO2 07/19/18 0742 99 %     Weight 07/19/18 0744 145 lb (65.8 kg)     Height 07/19/18 0744 5\' 5"  (1.651 m)   Constitutional: Alert and oriented. Well appearing and in no acute distress. Eyes: Conjunctivae are normal.  Head: Atraumatic. Nose: No congestion/rhinnorhea. Mouth/Throat: Mucous membranes are moist.  Neck: No stridor.  No meningeal signs.  Cardiovascular: Normal rate, regular rhythm. Good peripheral circulation. Grossly normal heart sounds.   Respiratory: Normal respiratory effort.  No retractions. Lungs CTAB. Gastrointestinal: Soft and nontender. No distention.  Musculoskeletal: No lower extremity tenderness nor edema. No gross deformities of extremities. Neurologic:  Normal speech and language. No gross focal neurologic deficits are appreciated.  Skin:  Skin is warm, dry and intact. No rash noted.  ____________________________________________   LABS (all labs ordered are listed, but only abnormal results are displayed)  Labs Reviewed  COMPREHENSIVE METABOLIC PANEL - Abnormal; Notable for the following components:      Result Value   Creatinine, Ser 1.25 (*)    Total Protein 8.3 (*)    Alkaline Phosphatase 37 (*)  All other components within normal limits  CBC WITH DIFFERENTIAL/PLATELET - Abnormal; Notable for the following components:   Platelets 144 (*)    All other components within normal limits  CULTURE, BLOOD (ROUTINE X 2)  CULTURE, BLOOD (ROUTINE X 2)  NOVEL CORONAVIRUS, NAA (HOSPITAL ORDER, SEND-OUT TO REF LAB)   ____________________________________________  RADIOLOGY  Dg Chest Portable 1 View  Result Date: 07/19/2018 CLINICAL  DATA:  23 year old male with a history of body aches and diarrhea EXAM: PORTABLE CHEST 1 VIEW COMPARISON:  03/22/2016, 07/01/2013 FINDINGS: The heart size and mediastinal contours are within normal limits. Both lungs are clear. No displaced fracture. Redemonstration of scoliotic curvature of the thoracic spine IMPRESSION: Negative for acute cardiopulmonary disease Electronically Signed   By: Corrie Mckusick D.O.   On: 07/19/2018 08:45    ____________________________________________   PROCEDURES  Procedure(s) performed:   Procedures  None  ____________________________________________   INITIAL IMPRESSION / ASSESSMENT AND PLAN / ED COURSE  Pertinent labs & imaging results that were available during my care of the patient were reviewed by me and considered in my medical decision making (see chart for details).   Patient presents to the emergency department for evaluation of fever, body aches, diarrhea.  Very mild headache.  Patient's case is complicated by known HIV disease with noncompliance in regard to antiretroviral medications.  Unknown CD4 count.  Patient without clinical signs of clear opportunistic infection.  No concern clinically for meningitis.  COVID-19 is high on my differential and I do plan for testing here.  Discussed that patient will need to self quarantine until results have come back and he is without symptoms.  Plan to obtain chest x-ray along with screening blood work and blood cultures.  No UTI symptoms. No SIRS or concern for Sepsis clinically.   ETHERIDGE Bird was evaluated in Emergency Department on 07/19/2018 for the symptoms described in the history of present illness. He was evaluated in the context of the global COVID-19 pandemic, which necessitated consideration that the patient might be at risk for infection with the SARS-CoV-2 virus that causes COVID-19. Institutional protocols and algorithms that pertain to the evaluation of patients at risk for COVID-19 are in a  state of rapid change based on information released by regulatory bodies including the CDC and federal and state organizations. These policies and algorithms were followed during the patient's care in the ED.  09:00 AM  Patient reevaluated.  Continues to look and feel well overall.  X-ray with no acute infiltrate or other finding.  Lab work is largely unremarkable.  Patient has scheduled follow-up with infectious disease next month.  Discussed home quarantine and social distancing measures at home.  Provided a work note.  Patient will follow results of COVID-19 test on MyChart.  Discussed ED return precautions.  Discussed follow-up via telehealth for more mild symptoms.    ____________________________________________  FINAL CLINICAL IMPRESSION(S) / ED DIAGNOSES  Final diagnoses:  Flu-like symptoms    MEDICATIONS GIVEN DURING THIS VISIT:  Medications  acetaminophen (TYLENOL) tablet 1,000 mg (1,000 mg Oral Given 07/19/18 0813)     Note:  This document was prepared using Dragon voice recognition software and may include unintentional dictation errors.  Nanda Quinton, MD Emergency Medicine    Meric Joye, [facility], MD 07/19/18 [phone]

## 2018-07-19 NOTE — ED Notes (Signed)
ED Provider at bedside. 

## 2018-07-19 NOTE — ED Triage Notes (Signed)
Yesterday, body aches , diarrhea . Temp 101.5 at 0600 no cough

## 2018-07-20 LAB — NOVEL CORONAVIRUS, NAA (HOSP ORDER, SEND-OUT TO REF LAB; TAT 18-24 HRS): SARS-CoV-2, NAA: NOT DETECTED

## 2018-07-24 LAB — CULTURE, BLOOD (ROUTINE X 2)
Culture: NO GROWTH
Culture: NO GROWTH
Special Requests: ADEQUATE
Special Requests: ADEQUATE

## 2021-05-15 IMAGING — DX PORTABLE CHEST - 1 VIEW
1 series · 1 of 1 positions shown · non-contrast
Comparison: 03/22/2016, 07/01/2013

CLINICAL DATA: 22-year-old male with a history of body aches and
diarrhea

EXAM:
PORTABLE CHEST 1 VIEW

[chest ap]
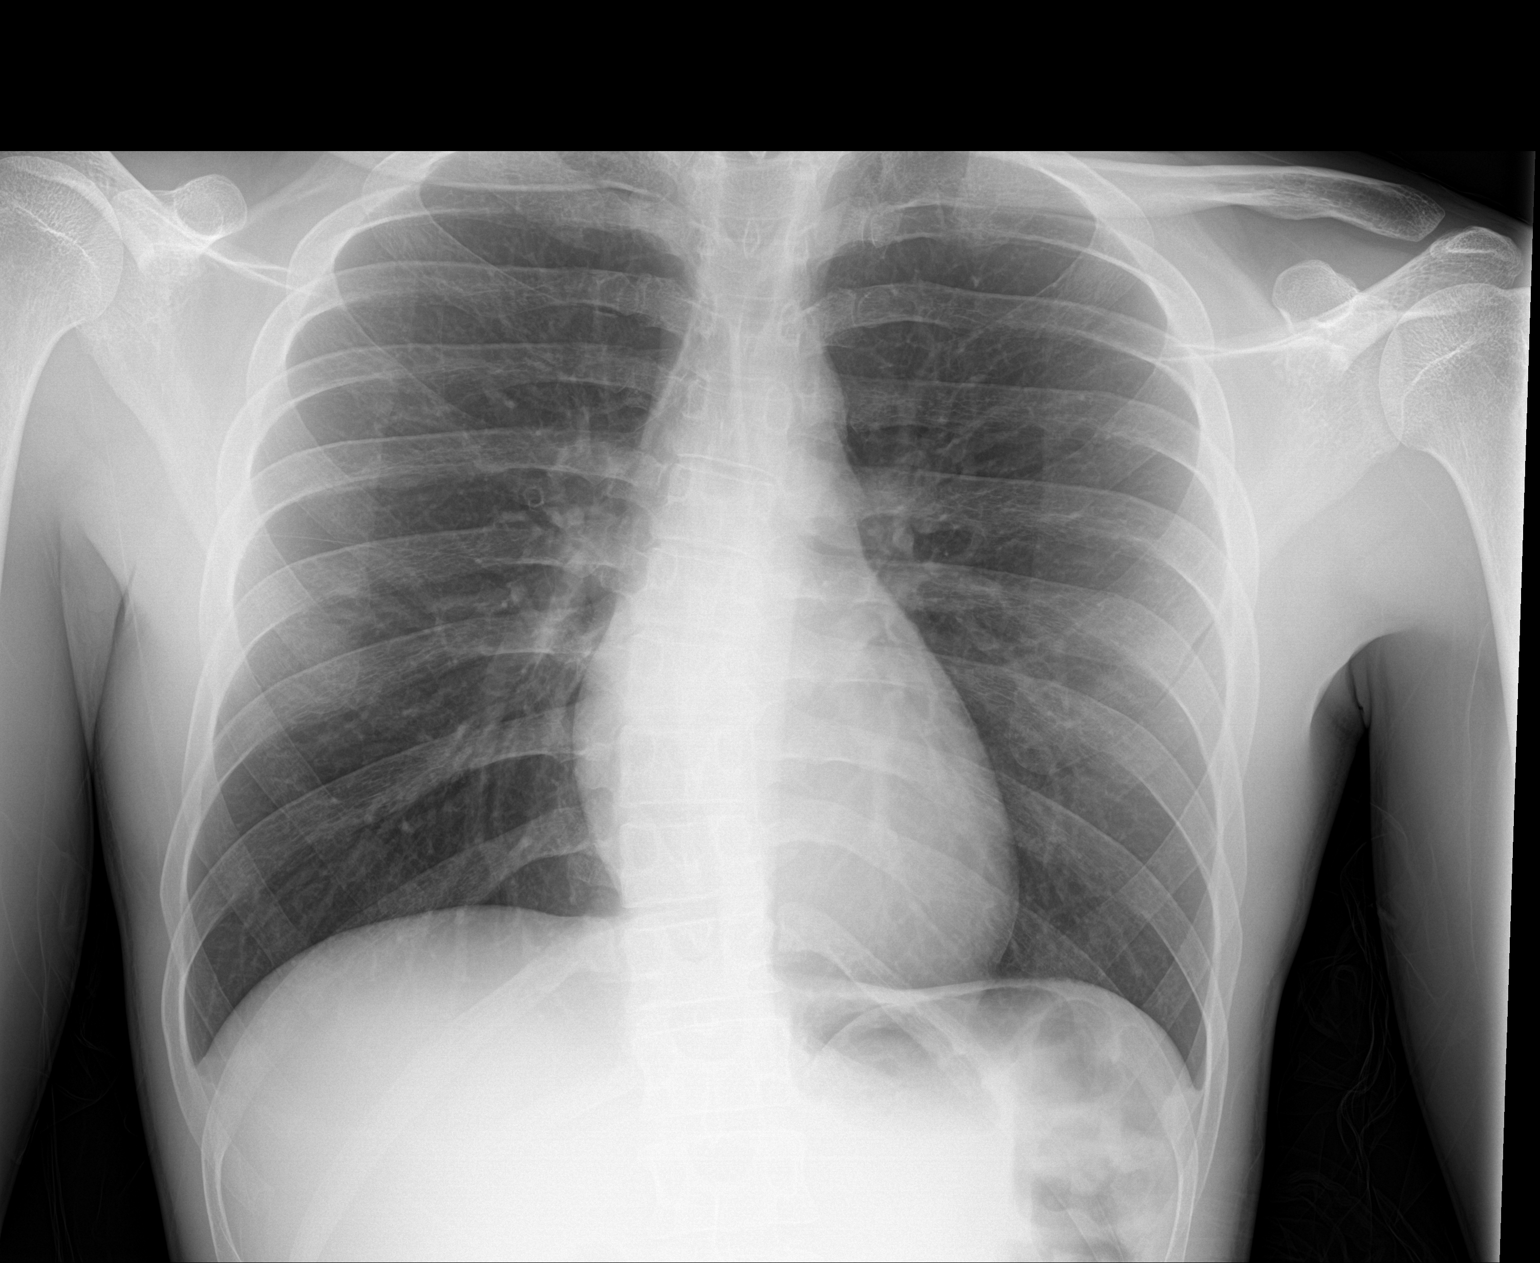

[1 of 1 positions shown; findings below may reference images not displayed]

FINDINGS: The heart size and mediastinal contours are within normal limits.
Both lungs are clear. No displaced fracture. Redemonstration of
scoliotic curvature of the thoracic spine
IMPRESSION: Negative for acute cardiopulmonary disease

## 2022-09-06 ENCOUNTER — Observation Stay (HOSPITAL_BASED_OUTPATIENT_CLINIC_OR_DEPARTMENT_OTHER)
Admission: EM | Admit: 2022-09-06 | Discharge: 2022-09-06 | Discharge status: Against Medical Advice | Payer: MEDICAID | Attending: Internal Medicine | Admitting: Internal Medicine

## 2022-09-06 ENCOUNTER — Inpatient Hospital Stay (HOSPITAL_COMMUNITY): Payer: MEDICAID

## 2022-09-06 ENCOUNTER — Other Ambulatory Visit: Payer: Self-pay

## 2022-09-06 ENCOUNTER — Encounter (HOSPITAL_BASED_OUTPATIENT_CLINIC_OR_DEPARTMENT_OTHER): Payer: Self-pay

## 2022-09-06 DIAGNOSIS — Z5329 Procedure and treatment not carried out because of patient's decision for other reasons: Secondary | ICD-10-CM | POA: Diagnosis not present

## 2022-09-06 DIAGNOSIS — T43505A Adverse effect of unspecified antipsychotics and neuroleptics, initial encounter: Secondary | ICD-10-CM | POA: Diagnosis present

## 2022-09-06 DIAGNOSIS — G210 Malignant neuroleptic syndrome: Secondary | ICD-10-CM | POA: Diagnosis present

## 2022-09-06 DIAGNOSIS — Z91040 Latex allergy status: Secondary | ICD-10-CM

## 2022-09-06 DIAGNOSIS — F909 Attention-deficit hyperactivity disorder, unspecified type: Secondary | ICD-10-CM | POA: Diagnosis present

## 2022-09-06 DIAGNOSIS — E876 Hypokalemia: Secondary | ICD-10-CM | POA: Diagnosis present

## 2022-09-06 DIAGNOSIS — Z91148 Patient's other noncompliance with medication regimen for other reason: Secondary | ICD-10-CM | POA: Diagnosis not present

## 2022-09-06 DIAGNOSIS — R4182 Altered mental status, unspecified: Secondary | ICD-10-CM

## 2022-09-06 DIAGNOSIS — R000 Tachycardia, unspecified: Secondary | ICD-10-CM | POA: Diagnosis present

## 2022-09-06 DIAGNOSIS — F200 Paranoid schizophrenia: Secondary | ICD-10-CM | POA: Diagnosis not present

## 2022-09-06 DIAGNOSIS — Z21 Asymptomatic human immunodeficiency virus [HIV] infection status: Secondary | ICD-10-CM | POA: Diagnosis not present

## 2022-09-06 DIAGNOSIS — G928 Other toxic encephalopathy: Secondary | ICD-10-CM | POA: Diagnosis not present

## 2022-09-06 DIAGNOSIS — G9340 Encephalopathy, unspecified: Secondary | ICD-10-CM | POA: Diagnosis not present

## 2022-09-06 DIAGNOSIS — F17210 Nicotine dependence, cigarettes, uncomplicated: Secondary | ICD-10-CM | POA: Diagnosis not present

## 2022-09-06 DIAGNOSIS — M6282 Rhabdomyolysis: Secondary | ICD-10-CM | POA: Diagnosis not present

## 2022-09-06 DIAGNOSIS — R9431 Abnormal electrocardiogram [ECG] [EKG]: Secondary | ICD-10-CM | POA: Diagnosis present

## 2022-09-06 DIAGNOSIS — G9341 Metabolic encephalopathy: Secondary | ICD-10-CM | POA: Diagnosis present

## 2022-09-06 HISTORY — DX: Paranoid schizophrenia: F20.0

## 2022-09-06 LAB — BLOOD GAS, VENOUS
Acid-Base Excess: 2.1 mmol/L — ABNORMAL HIGH (ref 0.0–2.0)
Bicarbonate: 28.7 mmol/L — ABNORMAL HIGH (ref 20.0–28.0)
Drawn by: 623441
O2 Saturation: 59.9 %
Patient temperature: 36.9
pCO2, Ven: 52 mmHg (ref 44–60)
pH, Ven: 7.35 (ref 7.25–7.43)
pO2, Ven: 36 mmHg (ref 32–45)

## 2022-09-06 LAB — COMPREHENSIVE METABOLIC PANEL
ALT: 16 U/L (ref 0–44)
ALT: 20 U/L (ref 0–44)
AST: 39 U/L (ref 15–41)
AST: 55 U/L — ABNORMAL HIGH (ref 15–41)
Albumin: 4.2 g/dL (ref 3.5–5.0)
Albumin: 3.1 g/dL — ABNORMAL LOW (ref 3.5–5.0)
Alkaline Phosphatase: 29 U/L — ABNORMAL LOW (ref 38–126)
Alkaline Phosphatase: 23 U/L — ABNORMAL LOW (ref 38–126)
Anion gap: 14 (ref 5–15)
Anion gap: 7 (ref 5–15)
BUN: 10 mg/dL (ref 6–20)
BUN: 8 mg/dL (ref 6–20)
CO2: 24 mmol/L (ref 22–32)
CO2: 25 mmol/L (ref 22–32)
Calcium: 10 mg/dL (ref 8.9–10.3)
Calcium: 8.4 mg/dL — ABNORMAL LOW (ref 8.9–10.3)
Chloride: 100 mmol/L (ref 98–111)
Chloride: 104 mmol/L (ref 98–111)
Creatinine, Ser: 1.14 mg/dL (ref 0.61–1.24)
Creatinine, Ser: 0.97 mg/dL (ref 0.61–1.24)
GFR, Estimated: >60 mL/min (ref >60)
GFR, Estimated: >60 mL/min (ref >60)
Glucose, Bld: 106 mg/dL — ABNORMAL HIGH (ref 70–99)
Glucose, Bld: 84 mg/dL (ref 70–99)
Potassium: 3.4 mmol/L — ABNORMAL LOW (ref 3.5–5.1)
Potassium: 3.5 mmol/L (ref 3.5–5.1)
Sodium: 138 mmol/L (ref 135–145)
Sodium: 136 mmol/L (ref 135–145)
Total Bilirubin: 0.2 mg/dL — ABNORMAL LOW (ref 0.3–1.2)
Total Bilirubin: 0.6 mg/dL (ref 0.3–1.2)
Total Protein: 8.6 g/dL — ABNORMAL HIGH (ref 6.5–8.1)
Total Protein: 6.5 g/dL (ref 6.5–8.1)

## 2022-09-06 LAB — CBC
HCT: 34.5 % — ABNORMAL LOW (ref 39.0–52.0)
HCT: 28.5 % — ABNORMAL LOW (ref 39.0–52.0)
Hemoglobin: 11.5 g/dL — ABNORMAL LOW (ref 13.0–17.0)
Hemoglobin: 9.5 g/dL — ABNORMAL LOW (ref 13.0–17.0)
MCH: 30.1 pg (ref 26.0–34.0)
MCH: 30.4 pg (ref 26.0–34.0)
MCHC: 33.3 g/dL (ref 30.0–36.0)
MCHC: 33.3 g/dL (ref 30.0–36.0)
MCV: 90.3 fL (ref 80.0–100.0)
MCV: 91.1 fL (ref 80.0–100.0)
Platelets: 217 10*3/uL (ref 150–400)
Platelets: 168 10*3/uL (ref 150–400)
RBC: 3.82 MIL/uL — ABNORMAL LOW (ref 4.22–5.81)
RBC: 3.13 MIL/uL — ABNORMAL LOW (ref 4.22–5.81)
RDW: 12.1 % (ref 11.5–15.5)
RDW: 12.4 % (ref 11.5–15.5)
WBC: 5.7 10*3/uL (ref 4.0–10.5)
WBC: 4.5 10*3/uL (ref 4.0–10.5)
nRBC: 0 % (ref 0.0–0.2)
nRBC: 0 % (ref 0.0–0.2)

## 2022-09-06 LAB — URINALYSIS, ROUTINE W REFLEX MICROSCOPIC
Bilirubin Urine: NEGATIVE
Glucose, UA: NEGATIVE mg/dL
Hgb urine dipstick: NEGATIVE
Ketones, ur: NEGATIVE mg/dL
Leukocytes,Ua: NEGATIVE
Nitrite: NEGATIVE
Protein, ur: NEGATIVE mg/dL
Specific Gravity, Urine: 1.028 (ref 1.005–1.030)
pH: 5 (ref 5.0–8.0)

## 2022-09-06 LAB — RAPID URINE DRUG SCREEN, HOSP PERFORMED
Amphetamines: NOT DETECTED
Barbiturates: NOT DETECTED
Benzodiazepines: POSITIVE — AB
Cocaine: NOT DETECTED
Opiates: NOT DETECTED
Tetrahydrocannabinol: NOT DETECTED

## 2022-09-06 LAB — CK
Total CK: 1232 U/L — ABNORMAL HIGH (ref 49–397)
Total CK: 2384 U/L — ABNORMAL HIGH (ref 49–397)

## 2022-09-06 LAB — TSH: TSH: 0.452 u[IU]/mL (ref 0.350–4.500)

## 2022-09-06 LAB — TROPONIN I (HIGH SENSITIVITY)
Troponin I (High Sensitivity): 6 ng/L (ref <18)
Troponin I (High Sensitivity): 5 ng/L (ref <18)

## 2022-09-06 LAB — MAGNESIUM: Magnesium: 2 mg/dL (ref 1.7–2.4)

## 2022-09-06 LAB — HIV ANTIBODY (ROUTINE TESTING W REFLEX): HIV Screen 4th Generation wRfx: REACTIVE — AB

## 2022-09-06 LAB — LACTIC ACID, PLASMA
Lactic Acid, Venous: 1 mmol/L (ref 0.5–1.9)
Lactic Acid, Venous: 1.1 mmol/L (ref 0.5–1.9)

## 2022-09-06 LAB — CBG MONITORING, ED: Glucose-Capillary: 124 mg/dL — ABNORMAL HIGH (ref 70–99)

## 2022-09-06 LAB — PHOSPHORUS: Phosphorus: 3.4 mg/dL (ref 2.5–4.6)

## 2022-09-06 LAB — AMMONIA: Ammonia: 23 umol/L (ref 9–35)

## 2022-09-06 MED ORDER — LORAZEPAM 2 MG/ML IJ SOLN: 1.0000 mg | INTRAMUSCULAR | Status: DC | PRN

## 2022-09-06 MED ORDER — LORAZEPAM 1 MG PO TABS: 1.0000 mg | ORAL_TABLET | ORAL | Status: DC | PRN

## 2022-09-06 MED ORDER — LACTATED RINGERS IV BOLUS
1000.0000 mL | Freq: Once | INTRAVENOUS | Status: AC
  Administered 2022-09-06: 1000 mL via INTRAVENOUS

## 2022-09-06 MED ORDER — MAGNESIUM SULFATE 2 GM/50ML IV SOLN
2.0000 g | Freq: Once | INTRAVENOUS | Status: AC
  Administered 2022-09-06: 2 g via INTRAVENOUS
  Filled 2022-09-06: qty 50

## 2022-09-06 MED ORDER — LORAZEPAM 2 MG/ML IJ SOLN
1.0000 mg | Freq: Once | INTRAMUSCULAR | Status: AC
  Administered 2022-09-06: 1 mg via INTRAVENOUS
  Filled 2022-09-06 (×2): qty 1

## 2022-09-06 MED ORDER — SODIUM CHLORIDE 0.9 % IV BOLUS
1000.0000 mL | Freq: Once | INTRAVENOUS | Status: AC
  Administered 2022-09-06: 1000 mL via INTRAVENOUS

## 2022-09-06 MED ORDER — LORAZEPAM 2 MG/ML IJ SOLN
1.0000 mg | Freq: Once | INTRAMUSCULAR | Status: AC
  Administered 2022-09-06: 1 mg via INTRAVENOUS
  Filled 2022-09-06: qty 1

## 2022-09-06 MED ORDER — LACTATED RINGERS IV SOLN: INTRAVENOUS | Status: DC

## 2022-09-06 MED ORDER — ALBUTEROL SULFATE (2.5 MG/3ML) 0.083% IN NEBU: 2.5000 mg | INHALATION_SOLUTION | RESPIRATORY_TRACT | Status: DC | PRN

## 2022-09-06 MED ORDER — HEPARIN SODIUM (PORCINE) 5000 UNIT/ML IJ SOLN
5000.0000 [IU] | Freq: Three times a day (TID) | INTRAMUSCULAR | Status: DC
  Administered 2022-09-06: 5000 [IU] via SUBCUTANEOUS
  Filled 2022-09-06: qty 1

## 2022-09-06 NOTE — ED Provider Notes (Signed)
[facility] Emergency Department Provider Note MRN:  [id number]  Arrival date & time: 09/06/22     Chief Complaint   Altered Mental Status   History of Present Illness   Gary Bird is a 27 y.o. year-old male with a history of schizophrenia presenting to the ED with chief complaint of altered mental status.  Patient has been extremely altered for the past 2 days.  Mother explains that he was recently hospitalized psychiatrically.  He had been without his antipsychotics for several months.  They gave him a long-acting haloperidol shot shortly before discharge.  Mom thinks it was a 200 mg shot.  Since he has been home he has been staring into space, not able to take care of himself, profusely sweating shaking and rigid.  Home medications for sleep including haloperidol and hydroxyzine, these are not helping.  Review of Systems  I was unable to obtain a full/accurate HPI, PMH, or ROS due to the patient's altered mental status.  Patient's Health History    Past Medical History:  Diagnosis Date   ADHD (attention deficit hyperactivity disorder)    Depression    HIV positive (HCC)    Paranoid schizophrenia (HCC)     Past Surgical History:  Procedure Laterality Date   ADENOIDECTOMY W/ MYRINGOTOMY      History reviewed. No pertinent family history.  Social History   Socioeconomic History   Marital status: Single    Spouse name: Not on file   Number of children: Not on file   Years of education: Not on file   Highest education level: Not on file  Occupational History   Not on file  Tobacco Use   Smoking status: Every Day    Types: Cigarettes   Smokeless tobacco: Never  Vaping Use   Vaping status: Some Days  Substance and Sexual Activity   Alcohol use: Yes    Comment: occ   Drug use: No   Sexual activity: Not on file  Other Topics Concern   Not on file  Social History Narrative   Not on file   Social Determinants of Health   Financial  Resource Strain: Not on file  Food Insecurity: Not on file  Transportation Needs: Not on file  Physical Activity: Not on file  Stress: Not on file  Social Connections: Not on file  Intimate Partner Violence: Not on file     Physical Exam   Vitals:   09/06/22 0023  BP: 123/83  Pulse: (!) 101  Resp: 20  Temp: 97.8 F (36.6 C)  SpO2: 100%    CONSTITUTIONAL: Ill-appearing, NAD, diaphoretic and tremulous NEURO/PSYCH: Staring intently straight ahead, does not really respond to visual stimuli in the room, will answer questions and follow commands occasionally, diffuse muscle rigidity noted EYES:  eyes equal and reactive ENT/NECK:  no LAD, no JVD CARDIO: Tachycardic rate, well-perfused, normal S1 and S2 PULM:  CTAB no wheezing or rhonchi GI/GU:  non-distended, non-tender MSK/SPINE:  No gross deformities, no edema SKIN:  no rash, atraumatic   *Additional and/or pertinent findings included in MDM below  Diagnostic and Interventional Summary    EKG Interpretation Date/Time:    Ventricular Rate:    PR Interval:    QRS Duration:    QT Interval:    QTC Calculation:   R Axis:      Text Interpretation:         Labs Reviewed  CBC - Abnormal; Notable for the following components:  Result Value   RBC 3.82 (*)    Hemoglobin 11.5 (*)    HCT 34.5 (*)    All other components within normal limits  COMPREHENSIVE METABOLIC PANEL - Abnormal; Notable for the following components:   Potassium 3.4 (*)    Glucose, Bld 106 (*)    Total Protein 8.6 (*)    Alkaline Phosphatase 29 (*)    Total Bilirubin 0.2 (*)    All other components within normal limits  CK - Abnormal; Notable for the following components:   Total CK 1,232 (*)    All other components within normal limits  CBG MONITORING, ED - Abnormal; Notable for the following components:   Glucose-Capillary 124 (*)    All other components within normal limits    No orders to display    Medications  magnesium sulfate IVPB  2 g 50 mL (2 g Intravenous New Bag/Given 09/06/22 0125)  lactated ringers bolus 1,000 mL (1,000 mLs Intravenous New Bag/Given 09/06/22 0122)  lactated ringers bolus 1,000 mL (1,000 mLs Intravenous New Bag/Given 09/06/22 0122)  LORazepam (ATIVAN) injection 1 mg (1 mg Intravenous Given 09/06/22 0123)     Procedures  /  Critical Care .Critical Care  Performed by: Sabas Sous, MD Authorized by: Sabas Sous, MD   Critical care provider statement:    Critical care time (minutes):  35   Critical care was necessary to treat or prevent imminent or life-threatening deterioration of the following conditions: Neuroleptic malignant syndrome.   Critical care was time spent personally by me on the following activities:  Development of treatment plan with patient or surrogate, discussions with consultants, evaluation of patient's response to treatment, examination of patient, ordering and review of laboratory studies, ordering and review of radiographic studies, ordering and performing treatments and interventions, pulse oximetry, re-evaluation of patient's condition and review of old charts   ED Course and Medical Decision Making  Initial Impression and Ddx History and presentation highly concerning for neuroleptic malignant syndrome.  Providing fluids, Ativan, checking labs for acute kidney injury, rhabdomyolysis, etc.  Past medical/surgical history that increases complexity of ED encounter: History of schizophrenia  Interpretation of Diagnostics I personally reviewed the EKG and my interpretation is as follows: Sinus tachycardia, prolonged QT  Labs reveal mild CK elevation greater than 1200, otherwise no significant blood count or electrolyte disturbance.  Patient Reassessment and Ultimate Disposition/Management     Patient's status is improved after Ativan.  Given the prolonged QT MD CK elevation and patient's mental status change will admit to medicine.  Patient management required  discussion with the following services or consulting groups:  Hospitalist Service  Complexity of Problems Addressed Acute illness or injury that poses threat of life of bodily function  Additional Data Reviewed and Analyzed Further history obtained from: Further history from spouse/family member  Additional Factors Impacting ED Encounter Risk Consideration of hospitalization  Elmer Sow. 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

## 2022-09-06 NOTE — ED Notes (Signed)
Pt BP 87/50, EDP notified

## 2022-09-06 NOTE — Consult Note (Signed)
Critical care attending attestation note:  Patient seen and examined and relevant ancillary tests reviewed.  I agree with the assessment and plan of care as outlined by Joneen Roach, NP.   Synopsis of assessment and plan:  Altered mental status associated with reported rigidity after receiving haloperidol depot.  He has been altered since then with more somnolence. On examination he is awake and able to follow commands.  He has a mild tremor which appears stimulus induced.  He is not febrile not tachycardic and not hypertensive.  He was able to assist with transfer from stretcher to CT scan. Laboratory investigations verbal for mildly elevated CPK.  No leukocytosis.  CT head is normal. -Overall picture not consistent with neuroleptic malignant syndrome.  Nonspecific tremor which may have resulted in mild CPK elevation. -Insufficient features suggest clear-cut neuroleptic malignant syndrome as to warrant administration of dantrolene. -At present, would recommend holding any further antipsychotics, using benzodiazepines for agitation and as a muscle relaxant.  Trend CPK. -Call critical care back if patient becomes febrile and CPK rises above 10,000 -Consider psychiatry or neurology consultation if abnormal tremor persists. -Mentation appears to have improved as well.  Do not feel he needs LP urgently.    Lynnell Catalan, MD [facility] ICU Physician [facility] [place] [facility]  Pager: [phone] Mobile: [phone] After hours: [phone].  09/06/2022, 3:57 PM

## 2022-09-06 NOTE — Progress Notes (Signed)
[facility] night cross cover note:  PRN iv ativan added for anxiety/agitation in this patient who is admitted regarding concerns for neuroleptic malignant syndrome.    Newton Pigg, DO Hospitalist

## 2022-09-06 NOTE — Progress Notes (Signed)
Date: 09/06/2022 Patient: Gary Bird Admitted: 09/06/2022 12:32 AM Attending Provider: Lurline Del, MD  Gary Bird has made the decision for the patient to leave the emergency department against the advice of Lurline Del, MD.  He has been informed and understands the inherent risks, including death.  He has decided to accept the responsibility for this decision. Gary Bird and all necessary parties have been advised that he may return for further evaluation or treatment. His condition at time of discharge was Fair.  Gary Bird had current vital signs as follows:  Blood pressure 107/64, pulse 84, temperature 98.2 F (36.8 C), temperature source Oral, resp. rate 15, height 5\' 5"  (1.651 m), weight 56.7 kg, SpO2 100%.   Gary Bird has signed the Leaving Against Medical Advice form prior to leaving the department.  Lyndal Pulley 09/06/2022

## 2022-09-06 NOTE — ED Notes (Signed)
Mother verbalizes concern that "he is not ready to come home, still tremulous and shaky, that he got to much haldol and the effects haven't worn off yet. She does not plan to come to pick him up and she will wait to hear from admitting team". Mother "agrees with continuing with admission. She is at work".

## 2022-09-06 NOTE — ED Notes (Signed)
Mother contacted by phone, and updated. Bed assignment received at [facility]. Waiting for it to be cleaned and ready. Pt and mother updated. Mother speaking with pt encouraging continuing with plan of admission and transfer. EDP updated, and will see pt.

## 2022-09-06 NOTE — Progress Notes (Signed)
Hospitalist Transfer Note:    Nursing staff, Please call [facility] Admits & Consults System-Wide number on Amion [phone]) as soon as patient's arrival, so appropriate admitting provider can evaluate the pt.   Transferring facility: [facility] Requesting provider: Dr. Pilar Plate (EDP at [facility]) Reason for transfer: admission for further evaluation and management of acute encephalopathy and concern for neuroleptic malignant syndrome, rhabdomyolysis.    27 year old male with history of schizophrenia, who presented to [facility] ED for evaluation of altered mental status.   The patient is accompanied by his mother, who provides the history.  The patient has a history of schizophrenia, and was recently hospitalized in a psychiatric facility for 1 week (at either [facility] or [facility]) for complications relating to his schizophrenia.  Prior to discharge from that psychiatric facility, he received a long-acting dose of 200 mg of IV Haldol.  Following discharge from a psychiatric facility, the patient is also been on oral Haldol.  Patient's mother brings the patient into the [facility] this evening for evaluation of altered mental status, noted that the patient has appeared confused, at times staring off, and noted to be sweating profusely at times.  In the ED, muscular rigidity was noted.  Mother conveys that at baseline, the patient is fully conversant and able to significantly contribute to his own ADLs.  Vital signs in the ED were notable for the following: Afebrile; heart rates in the low 100s; blood pressure 123/83.  Labs were notable for CPK of 1200.  EKG reflects sinus tachycardia with QTc prolongation.  Medications administered prior to transfer included the following: 2 L of LR as well as dose IV Ativan.    Subsequently, I accepted this patient for transfer for inpatient admission to a pcu bed at [facility] or [facility] (first available) for further work-up and management of the above.        Newton Pigg,  DO Hospitalist
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
## 2022-09-06 NOTE — ED Notes (Signed)
Arousable to voice, alert, NAD, calm, interactive, speech clear, coherent, oriented to person, place. Some confusion on time and circumstances. Denies pain or nausea, denies needs.

## 2022-09-06 NOTE — Consult Note (Signed)
Psych consult for NMS, received Haldol deconate on 08/09. Team considered this an emergency consult, and patient was seen after normal consult hours due to severity, brief evaluation provided below.   Patient Encounter:  Providers Present: Patient was seen and briefly assessed by this nurse practitioner and Dr. Weston Settle.  Mental Status Exam: The patient was alert and oriented, calm, and cooperative during the assessment. He engaged with both providers and answered most questions appropriately, though some responses were incorrect, indicating some delay and confusion. He is a poor historian and was unable to provide many details regarding his mental health. When asked about his Darrin Luis, he was unclear about the timing of his next follow-up with Infectious Disease (ID). Support, encouragement, and reassurance were offered.  Physical Exam:  The patient appeared very tired and was noted to be yawning excessively. While sitting still, he exhibited whole-body shivering, although he reported feeling comfortable and did not request additional blankets or a thermostat adjustment. Speech was slow and monotone but clear. He denied any immediate psychiatric concerns at this time. Assessment:  Neurological Concerns: There is evidence of some cognitive delay and confusion. Although the patient denies psychiatric concerns, he remains at risk for further complications given his current medication regimen.  Risk for Neuroleptic Malignant Syndrome (NMS): After receiving long-acting haloperidol decanoate, the patient's haloperidol levels will continue to rise, placing him at risk for developing NMS. As is he does meet criteria for NMS, with two major criteria (exposure to dopaminergic agent and muscle rigidity) and more than two minor criteria (CPK elevation, tremor/shivering, tachycardia, and altered/confused mental state), athough he clinically looks stable.   Plan:  Medication Management: Continue with neurology and  CCM recommendations. Consider replacing Genvoya with a medication of a safer profile due to potential moderate to severe complications when combined with haloperidol decanoate.  Monitoring: Closely monitor the patient for any worsening of symptoms. Continue to use PRN benzodiazepines for agitation. If the condition worsens, reconsider the need for dantrolene.  Follow-Up: Psychiatry consult service will continue to follow the patient closely.  Summary: This is a very unfortunate case for this young patient, requiring careful monitoring and thoughtful management to mitigate risks associated with his current treatment regimen.

## 2022-09-06 NOTE — ED Notes (Signed)
ED TO INPATIENT HANDOFF REPORT  ED Nurse Name and Phone #: Al Corpus, RN [phone]  S Name/Age/Gender Gary Bird 27 y.o. male Room/Bed: MH03/MH03  Code Status   Code Status: Not on file  Home/SNF/Other Home Patient oriented to: self and place Is this baseline? No   Triage Complete: Triage complete  Chief Complaint Acute encephalopathy [G93.40]  Triage Note Pt has was dc'd from [facility] 8/4-8/9 Pt's meds have been changed  Haldol injection given during hospitalization and she feels the dosage was increased Mom reports he has been sweaty and shaking constantly has slept 3-4 hrs total since Friday. Pt is total care and normally he feeds himself and dresses himself. He was prescribed Atarax and Haldol to take nightly and Mom reports these have not helped Pt has appt with [facility] on Wed    Allergies Allergies  Allergen Reactions   Latex Rash    Level of Care/Admitting Diagnosis ED Disposition     ED Disposition  Admit   Condition  --   Comment  Hospital Area: [facility] [[id number]]  Level of Care: Progressive [102]  Admit to Progressive based on following criteria: MULTISYSTEM THREATS such as stable sepsis, metabolic/electrolyte imbalance with or without encephalopathy that is responding to early treatment.  May admit patient to [facility] or [facility] if equivalent level of care is available:: Yes  Interfacility transfer: Yes  Covid Evaluation: Asymptomatic - no recent exposure (last 10 days) testing not required  Diagnosis: Acute encephalopathy [254270]  Admitting Physician: Angie Fava [[id number]]  Attending Physician: Angie Fava [[id number]]  Certification:: I certify this patient will need inpatient services for at least 2 midnights  Estimated Length of Stay: 2          B Medical/Surgery History Past Medical History:  Diagnosis Date   ADHD (attention deficit hyperactivity disorder)    Depression    HIV positive (HCC)     Paranoid schizophrenia (HCC)    Past Surgical History:  Procedure Laterality Date   ADENOIDECTOMY W/ MYRINGOTOMY       A IV Location/Drains/Wounds Patient Lines/Drains/Airways Status     Active Line/Drains/Airways     Name Placement date Placement time Site Days   Peripheral IV 09/06/22 20 G Right Antecubital 09/06/22  0106  Antecubital  less than 1            Intake/Output Last 24 hours  Intake/Output Summary (Last 24 hours) at 09/06/2022 1149 Last data filed at 09/06/2022 0447 Gross per 24 hour  Intake 3006.8 ml  Output --  Net 3006.8 ml    Labs/Imaging Results for orders placed or performed during the hospital encounter of 09/06/22 (from the past 48 hour(s))  CBG monitoring, ED     Status: Abnormal   Collection Time: 09/06/22 12:24 AM  Result Value Ref Range   Glucose-Capillary 124 (H) 70 - 99 mg/dL    Comment: Glucose reference range applies only to samples taken after fasting for at least 8 hours.   Comment 1 Notify RN   CBC     Status: Abnormal   Collection Time: 09/06/22  1:08 AM  Result Value Ref Range   WBC 5.7 4.0 - 10.5 K/uL   RBC 3.82 (L) 4.22 - 5.81 MIL/uL   Hemoglobin 11.5 (L) 13.0 - 17.0 g/dL   HCT 60.7 (L) 37.1 - 06.2 %   MCV 90.3 80.0 - 100.0 fL   MCH 30.1 26.0 - 34.0 pg   MCHC 33.3 30.0 -  36.0 g/dL   RDW 81.0 17.5 - 10.2 %   Platelets 217 150 - 400 K/uL   nRBC 0.0 0.0 - 0.2 %    Comment: Performed at [facility], [address] [place], [place] [address]  Comprehensive metabolic panel     Status: Abnormal   Collection Time: 09/06/22  1:08 AM  Result Value Ref Range   Sodium 138 135 - 145 mmol/L   Potassium 3.4 (L) 3.5 - 5.1 mmol/L   Chloride 100 98 - 111 mmol/L   CO2 24 22 - 32 mmol/L   Glucose, Bld 106 (H) 70 - 99 mg/dL    Comment: Glucose reference range applies only to samples taken after fasting for at least 8 hours.   BUN 10 6 - 20 mg/dL   Creatinine, Ser 7.82 0.61 - 1.24 mg/dL   Calcium 42.3 8.9 - 53.6 mg/dL   Total  Protein 8.6 (H) 6.5 - 8.1 g/dL   Albumin 4.2 3.5 - 5.0 g/dL   AST 39 15 - 41 U/L   ALT 16 0 - 44 U/L   Alkaline Phosphatase 29 (L) 38 - 126 U/L   Total Bilirubin 0.2 (L) 0.3 - 1.2 mg/dL   GFR, Estimated >14 >43 mL/min    Comment: (NOTE) Calculated using the CKD-EPI Creatinine Equation (2021)    Anion gap 14 5 - 15    Comment: Performed at [facility], [address] [place], [place] [address]  CK     Status: Abnormal   Collection Time: 09/06/22  1:08 AM  Result Value Ref Range   Total CK 1,232 (H) 49 - 397 U/L    Comment: Performed at [facility], [address] [place], [place] [address]   No results found.  Pending Labs Unresulted Labs (From admission, onward)    None       Vitals/Pain Today's Vitals   09/06/22 1115 09/06/22 1130 09/06/22 1145 09/06/22 1148  BP: 100/66 100/61 108/65   Pulse: (!) 54 (!) 55 (!) 57   Resp: 11 10 12    Temp:      TempSrc:      SpO2: 100% 100% 100%   Weight:      Height:      PainSc:    Asleep    Isolation Precautions No active isolations  Medications Medications  lactated ringers bolus 1,000 mL ( Intravenous Stopped 09/06/22 0221)  lactated ringers bolus 1,000 mL ( Intravenous Stopped 09/06/22 0221)  LORazepam (ATIVAN) injection 1 mg (1 mg Intravenous Given 09/06/22 0123)  magnesium sulfate IVPB 2 g 50 mL (0 g Intravenous Stopped 09/06/22 0219)  sodium chloride 0.9 % bolus 1,000 mL (0 mLs Intravenous Stopped 09/06/22 0447)  LORazepam (ATIVAN) injection 1 mg (1 mg Intravenous Given 09/06/22 1109)    Mobility walks     Focused Assessments Cardiac Assessment Handoff:  Cardiac Rhythm: Normal sinus rhythm (HR 64) Lab Results  Component Value Date   CKTOTAL 1,232 (H) 09/06/2022   No results found for: "DDIMER" Does the Patient currently have chest pain? No    R Recommendations: See Admitting Provider Note  Report given to:   Additional Notes: A&O x2, disoriented to time and situation, some  confusion, poor insight, mildly anxious, mother aware, cooperative, tremulous/ intermittent ridgidity. Fall risk.

## 2022-09-06 NOTE — ED Notes (Signed)
[facility]
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
## 2022-09-06 NOTE — Progress Notes (Signed)
TR[facility] night cross cover note:   I was notified by RN the patient's desire to leave AMA.   I subsequently evaluated the patient at bedside, and also discussed the patient's case with the patient's mother, who was also present at bedside.  The patient was alert and oriented, and demonstrated understanding of my recommendation that he remain in the hospital overnight for further evaluation management.  He also conveyed understanding of the risks of leaving the hospital AGAINST MEDICAL ADVICE, with these risks including increased risk of death.  His understanding of all the above the patient conveys, and states that he accepts the risk of leaving the hospital AMA, including increased risk of death, and states that he would still like to leave AMA this evening.   The patient's mother, present at bedside, also conveys understanding of my recommendation for the patient to remain in the hospital overnight, and understands the above risks of leaving the hospital AMA.  She is amenable to the patient leaving the hospital AMA this evening.  The patient subsequently left AMA.    Newton Pigg, DO Hospitalist

## 2022-09-06 NOTE — ED Notes (Signed)
Called [facility] for transport talked to [place] at 11:25

## 2022-09-06 NOTE — ED Triage Notes (Addendum)
Pt has was dc'd from [facility] 8/4-8/9 Pt's meds have been changed  Haldol injection given during hospitalization and she feels the dosage was increased Mom reports he has been sweaty and shaking constantly has slept 3-4 hrs total since Friday. Pt is total care and normally he feeds himself and dresses himself. He was prescribed Atarax and Haldol to take nightly and Mom reports these have not helped Pt has appt with [facility] on Wed

## 2022-09-06 NOTE — ED Notes (Addendum)
Removed self from monitor. Resting in dark. Arousable to voice. Remains A&Ox2, disoriented to date and situation. Denies pain, complaints or sx. Endorses some anxiety. Remains tremuolous. Able to hold conversation, choppy delayed answers. Wants to leave. Does not want to be admitted. Wants to speak with doctor and call mother. Returned to monitor. VSS.

## 2022-09-06 NOTE — ED Notes (Signed)
Resting/ sleeping, NAD, calm, HOB 60 degrees, VSS, BP low, will continue to monitor.

## 2022-09-06 NOTE — ED Notes (Signed)
Pt BP beginning to run low 90s/low 60s, EDP notified

## 2022-09-06 NOTE — Consult Note (Cosign Needed)
Neurology Consultation  Reason for Consult: Acute metabolic encephalopathy  Referring Physician: Dr. Maisie Fus  CC: Akinesia, shaking   History is obtained from: Patient, Chart review  HPI: Gary Bird is a 27 y.o. male with a medical history significant for ADHD, depression, paranoid schizophrenia, HIV, and history of syphilis who presented initially to [facility] due to altered mental status with increased somnolence at home.  Patient reportedly has been off of his medications since February 2023 with progressive decompensation of his schizophrenia since this time.  On 8/4, patient was involuntarily committed to [facility] regional behavioral health unit following an episode of physical aggression and was discharged on 09/02/2022.  Prior to discharge, patient received a 200 mg long-acting IV Haldol dose with prescribed home p.o. Haldol.  Since patient returned home, mother noted that he was confused, lethargic, episodes of staring, shaking associated with diaphoresis prompting patient's mother to bring him to the ED for further evaluation.  There was initial concern in the ED for periods of rigidity, hypotension, and labs revealed an elevated CPK of 1232.  Patient was admitted for concern for possible neuroleptic malignant syndrome.  On assessment, patient denies episodes of sweating, recent fever, changes in bowel or bladder function, or ongoing hypersomnolence.  Patient does complain of akinesia and shaking.  He also states that he has not taken any oral medications since his recent discharge on 09/02/2022.  ROS: A complete ROS was performed and is negative except as noted in the HPI.   Past Medical History:  Diagnosis Date   ADHD (attention deficit hyperactivity disorder)    Depression    HIV positive (HCC)    Paranoid schizophrenia (HCC)    Past Surgical History:  Procedure Laterality Date   ADENOIDECTOMY W/ MYRINGOTOMY     History reviewed. No pertinent family history.  Social  History:   reports that he has been smoking cigarettes. He has never used smokeless tobacco. He reports current alcohol use. He reports that he does not use drugs.  Medications  Current Facility-Administered Medications:    albuterol (PROVENTIL) (2.5 MG/3ML) 0.083% nebulizer solution 2.5 mg, 2.5 mg, Nebulization, Q2H PRN, Skip Mayer A, MD   heparin injection 5,000 Units, 5,000 Units, Subcutaneous, Q8H, Skip Mayer A, MD   lactated ringers bolus 1,000 mL, 1,000 mL, Intravenous, Once, Lurline Del, MD   lactated ringers infusion, , Intravenous, Continuous, Lurline Del, MD  Exam: Current vital signs: BP 106/65 (BP Location: Left Arm)   Pulse 63   Temp 98.4 F (36.9 C)   Resp 10   Ht 5\' 5"  (1.651 m)   Wt 56.7 kg   SpO2 99%   BMI 20.80 kg/m  Vital signs in last 24 hours: Temp:  [97.8 F (36.6 C)-98.4 F (36.9 C)] 98.4 F (36.9 C) (08/13 1310) Pulse Rate:  [50-106] 63 (08/13 1310) Resp:  [10-20] 10 (08/13 1310) BP: (81-123)/(50-87) 106/65 (08/13 1310) SpO2:  [97 %-100 %] 99 % (08/13 1322) Weight:  [56.7 kg] 56.7 kg (08/13 0027)  GENERAL: Drowsy, wakes to voice, alert, in no acute distress Psych: Flat affect, patient is cooperative with exam.  Does not appear to be attending to internal stimuli. Head: Normocephalic and atraumatic, without obvious abnormality EENT: Normal conjunctivae, dry mucous membranes, no OP obstruction LUNGS: Normal respiratory effort. Non-labored breathing on room air CV: Regular rate and rhythm on telemetry ABDOMEN: Soft, non-tender, non-distended Extremities: Warm, well perfused, without obvious deformity  NEURO:  Mental Status: Drowsy, wakes to voice. He is oriented  to self, age, and is able to give some details regarding history of presentation. Patient is not able to state his current location and incorrectly states that the month is June/July. He correctly states the year and his age. Latency of speech is noted. There  is no dysarthria or aphasia. Patient follows commands without difficulty.   Some bradykinesia is noted.   Cranial Nerves:  II: PERRL. Visual fields full, patient complains of diplopia not apparent on physical exam findings.  III, IV, VI: EOMI, patient will fixate and track examiner throughout V: Sensation is intact to light touch and symmetrical to face.  VII: Face is symmetric resting and movement VIII: Hearing intact to voice IX, X: Palate elevation is symmetric. Phonation normal.  XI: Normal sternocleidomastoid and trapezius muscle strength XII: Tongue protrudes midline without fasciculations.   Motor: 5/5 strength is all muscle groups without unilateral weakness or asymmetry.  There is no rigidity noted.  There is minimal fine tremor of the upper extremities that resolves with action. Tone is slightly increased throughout, worse in lower extremities than upper. Thre is mild cogwheeling of bilateral upper extremities. Bulk is normal.  Sensation: Intact to light touch bilaterally in all four extremities. No extinction to DSS present.  Coordination: No ataxia noted DTRs: Brisk throughout Gait: Deferred  Labs I have reviewed labs in epic and the results pertinent to this consultation are: CBC    Component Value Date/Time   WBC 4.5 09/06/2022 1347   RBC 3.13 (L) 09/06/2022 1347   HGB 9.5 (L) 09/06/2022 1347   HCT 28.5 (L) 09/06/2022 1347   PLT 168 09/06/2022 1347   MCV 91.1 09/06/2022 1347   MCH 30.4 09/06/2022 1347   MCHC 33.3 09/06/2022 1347   RDW 12.4 09/06/2022 1347   LYMPHSABS 1.4 07/19/2018 0815   MONOABS 0.7 07/19/2018 0815   EOSABS 0.0 07/19/2018 0815   BASOSABS 0.0 07/19/2018 0815   CMP     Component Value Date/Time   NA 136 09/06/2022 1347   K 3.5 09/06/2022 1347   CL 104 09/06/2022 1347   CO2 25 09/06/2022 1347   GLUCOSE 84 09/06/2022 1347   BUN 8 09/06/2022 1347   CREATININE 0.97 09/06/2022 1347   CALCIUM 8.4 (L) 09/06/2022 1347   PROT 6.5 09/06/2022 1347    ALBUMIN 3.1 (L) 09/06/2022 1347   AST 55 (H) 09/06/2022 1347   ALT 20 09/06/2022 1347   ALKPHOS 23 (L) 09/06/2022 1347   BILITOT 0.6 09/06/2022 1347   GFRNONAA >60 09/06/2022 1347   GFRAA >60 07/19/2018 0815   Lipid Panel  No results found for: "CHOL", "TRIG", "HDL", "CHOLHDL", "VLDL", "LDLCALC", "LDLDIRECT"  CK 1232 > 2384  Imaging I have reviewed the images obtained:  CT-scan of the brain 09/06/22: Pending  Assessment: 27 year old male with PMHx of ADHD, depression, paranoid schizophrenia, HIV, history of syphilis, and recent behavioral health hospitalization from 8/4 - 8/9.  At time of discharge on 8/9, patient received a long-acting IV Haldol injection of 200 mg.  Since the time of discharge, patient has been hypersomnolent at home with confusion, lethargy, staring spells, shaking at times associated with diaphoresis prompting further ED evaluation.  While in the ED, CPK was noted to be elevated to 1232 and there was some concern for rigidity, hypotensive episodes, and possible neuroleptic malignant syndrome for which patient was admitted. -Examination reveals patient with drowsiness, mildly increased tone and subtle cogwheeling, some confusion, no rigidity, nonspecific tremor, and flat affect.  Patient denies fever at home.  Patient denies diaphoresis and there is no evidence of diaphoresis on examination. -Chart review reveals some bradycardia with a low heart rate of 50 and very minimal tachycardia highest rate of 106 documented.  There are some hypertensive blood pressures with systolics in the 80s, diastolics in the 50s.  Patient remains afebrile. CK elevation at 1,232 > 2,384. Additionally, there is no leukocytosis noted on lab work.  -At this time, suspect patient's presentation is related to medication side effect though there is a low suspicion for neuroleptic malignant syndrome currently.   Recommendations: - Supportive care as you are, hydration, avoid further antipsychotic  use - Agree with psychiatry consultation for further psychiatric medication recommendations and evaluation - Continue to trend fever curve - Continue to trend CK -No further inpatient neurology workup recommended at this time.  Neurology will remain available on an as-needed basis for further questions or concerns. - Please re-consult for worsening mental status, new neurologic deficits, new development of criteria of NMS, or further neurologic concerns  Pt seen by NP/Neuro and later by MD. Note/plan to be edited by MD as needed.  Lanae Boast, AGAC-NP [facility] Pager: [phone]

## 2022-09-08 ENCOUNTER — Inpatient Hospital Stay (HOSPITAL_BASED_OUTPATIENT_CLINIC_OR_DEPARTMENT_OTHER)
Admission: EM | Admit: 2022-09-08 | Discharge: 2022-09-15 | DRG: 092 | Disposition: A | Discharge status: Home / Self Care | Payer: MEDICAID | Attending: Internal Medicine | Admitting: Internal Medicine

## 2022-09-08 ENCOUNTER — Encounter (HOSPITAL_BASED_OUTPATIENT_CLINIC_OR_DEPARTMENT_OTHER): Payer: Self-pay | Admitting: Emergency Medicine

## 2022-09-08 DIAGNOSIS — R7401 Elevation of levels of liver transaminase levels: Secondary | ICD-10-CM | POA: Diagnosis present

## 2022-09-08 DIAGNOSIS — M6282 Rhabdomyolysis: Secondary | ICD-10-CM | POA: Diagnosis present

## 2022-09-08 DIAGNOSIS — R251 Tremor, unspecified: Principal | ICD-10-CM

## 2022-09-08 DIAGNOSIS — Z91040 Latex allergy status: Secondary | ICD-10-CM

## 2022-09-08 DIAGNOSIS — T434X5A Adverse effect of butyrophenone and thiothixene neuroleptics, initial encounter: Secondary | ICD-10-CM | POA: Diagnosis present

## 2022-09-08 DIAGNOSIS — F17210 Nicotine dependence, cigarettes, uncomplicated: Secondary | ICD-10-CM | POA: Diagnosis present

## 2022-09-08 DIAGNOSIS — E876 Hypokalemia: Secondary | ICD-10-CM | POA: Diagnosis present

## 2022-09-08 DIAGNOSIS — F909 Attention-deficit hyperactivity disorder, unspecified type: Secondary | ICD-10-CM | POA: Diagnosis present

## 2022-09-08 DIAGNOSIS — R293 Abnormal posture: Secondary | ICD-10-CM | POA: Diagnosis present

## 2022-09-08 DIAGNOSIS — R292 Abnormal reflex: Secondary | ICD-10-CM | POA: Diagnosis present

## 2022-09-08 DIAGNOSIS — G259 Extrapyramidal and movement disorder, unspecified: Secondary | ICD-10-CM | POA: Diagnosis present

## 2022-09-08 DIAGNOSIS — Z91128 Patient's intentional underdosing of medication regimen for other reason: Secondary | ICD-10-CM

## 2022-09-08 DIAGNOSIS — F200 Paranoid schizophrenia: Secondary | ICD-10-CM | POA: Diagnosis present

## 2022-09-08 DIAGNOSIS — F32A Depression, unspecified: Secondary | ICD-10-CM | POA: Diagnosis present

## 2022-09-08 DIAGNOSIS — T50906A Underdosing of unspecified drugs, medicaments and biological substances, initial encounter: Secondary | ICD-10-CM | POA: Diagnosis present

## 2022-09-08 DIAGNOSIS — Z21 Asymptomatic human immunodeficiency virus [HIV] infection status: Secondary | ICD-10-CM | POA: Diagnosis present

## 2022-09-08 DIAGNOSIS — D649 Anemia, unspecified: Secondary | ICD-10-CM | POA: Diagnosis present

## 2022-09-08 DIAGNOSIS — I959 Hypotension, unspecified: Secondary | ICD-10-CM | POA: Diagnosis present

## 2022-09-08 DIAGNOSIS — G210 Malignant neuroleptic syndrome: Principal | ICD-10-CM | POA: Diagnosis present

## 2022-09-08 DIAGNOSIS — Z8619 Personal history of other infectious and parasitic diseases: Secondary | ICD-10-CM

## 2022-09-08 DIAGNOSIS — T375X6A Underdosing of antiviral drugs, initial encounter: Secondary | ICD-10-CM | POA: Diagnosis present

## 2022-09-08 DIAGNOSIS — G249 Dystonia, unspecified: Secondary | ICD-10-CM | POA: Diagnosis present

## 2022-09-08 NOTE — ED Triage Notes (Signed)
Continued tremors and diaphoresis after medication changes recently; apparently left BH in [facility]; pt staring, not answering questions in triage

## 2022-09-09 ENCOUNTER — Other Ambulatory Visit: Payer: Self-pay

## 2022-09-09 DIAGNOSIS — M6282 Rhabdomyolysis: Secondary | ICD-10-CM | POA: Diagnosis present

## 2022-09-09 DIAGNOSIS — G210 Malignant neuroleptic syndrome: Secondary | ICD-10-CM | POA: Diagnosis present

## 2022-09-09 DIAGNOSIS — F32A Depression, unspecified: Secondary | ICD-10-CM | POA: Diagnosis present

## 2022-09-09 DIAGNOSIS — Z21 Asymptomatic human immunodeficiency virus [HIV] infection status: Secondary | ICD-10-CM | POA: Diagnosis present

## 2022-09-09 DIAGNOSIS — R4182 Altered mental status, unspecified: Secondary | ICD-10-CM | POA: Diagnosis not present

## 2022-09-09 DIAGNOSIS — Z91040 Latex allergy status: Secondary | ICD-10-CM | POA: Diagnosis not present

## 2022-09-09 DIAGNOSIS — T434X5A Adverse effect of butyrophenone and thiothixene neuroleptics, initial encounter: Secondary | ICD-10-CM | POA: Diagnosis present

## 2022-09-09 DIAGNOSIS — I959 Hypotension, unspecified: Secondary | ICD-10-CM | POA: Diagnosis present

## 2022-09-09 DIAGNOSIS — Z91128 Patient's intentional underdosing of medication regimen for other reason: Secondary | ICD-10-CM | POA: Diagnosis not present

## 2022-09-09 DIAGNOSIS — Z8619 Personal history of other infectious and parasitic diseases: Secondary | ICD-10-CM | POA: Diagnosis not present

## 2022-09-09 DIAGNOSIS — F17210 Nicotine dependence, cigarettes, uncomplicated: Secondary | ICD-10-CM | POA: Diagnosis present

## 2022-09-09 DIAGNOSIS — R251 Tremor, unspecified: Secondary | ICD-10-CM | POA: Diagnosis present

## 2022-09-09 DIAGNOSIS — R293 Abnormal posture: Secondary | ICD-10-CM | POA: Diagnosis present

## 2022-09-09 DIAGNOSIS — R7401 Elevation of levels of liver transaminase levels: Secondary | ICD-10-CM | POA: Diagnosis present

## 2022-09-09 DIAGNOSIS — T50906A Underdosing of unspecified drugs, medicaments and biological substances, initial encounter: Secondary | ICD-10-CM | POA: Diagnosis present

## 2022-09-09 DIAGNOSIS — E876 Hypokalemia: Secondary | ICD-10-CM | POA: Diagnosis present

## 2022-09-09 DIAGNOSIS — F200 Paranoid schizophrenia: Secondary | ICD-10-CM | POA: Diagnosis present

## 2022-09-09 DIAGNOSIS — G249 Dystonia, unspecified: Secondary | ICD-10-CM | POA: Diagnosis present

## 2022-09-09 DIAGNOSIS — R292 Abnormal reflex: Secondary | ICD-10-CM | POA: Diagnosis present

## 2022-09-09 DIAGNOSIS — F909 Attention-deficit hyperactivity disorder, unspecified type: Secondary | ICD-10-CM | POA: Diagnosis present

## 2022-09-09 DIAGNOSIS — D649 Anemia, unspecified: Secondary | ICD-10-CM | POA: Diagnosis present

## 2022-09-09 DIAGNOSIS — R569 Unspecified convulsions: Secondary | ICD-10-CM | POA: Diagnosis not present

## 2022-09-09 DIAGNOSIS — T375X6A Underdosing of antiviral drugs, initial encounter: Secondary | ICD-10-CM | POA: Diagnosis present

## 2022-09-09 LAB — GLUCOSE, CAPILLARY
Glucose-Capillary: 114 mg/dL — ABNORMAL HIGH (ref 70–99)
Glucose-Capillary: 140 mg/dL — ABNORMAL HIGH (ref 70–99)
Glucose-Capillary: 64 mg/dL — ABNORMAL LOW (ref 70–99)
Glucose-Capillary: 68 mg/dL — ABNORMAL LOW (ref 70–99)
Glucose-Capillary: 92 mg/dL (ref 70–99)

## 2022-09-09 LAB — ETHANOL: Alcohol, Ethyl (B): <10 mg/dL (ref <10)

## 2022-09-09 LAB — COMPREHENSIVE METABOLIC PANEL
ALT: 23 U/L (ref 0–44)
AST: 53 U/L — ABNORMAL HIGH (ref 15–41)
Albumin: 4.1 g/dL (ref 3.5–5.0)
Alkaline Phosphatase: 33 U/L — ABNORMAL LOW (ref 38–126)
Anion gap: 11 (ref 5–15)
BUN: 6 mg/dL (ref 6–20)
CO2: 28 mmol/L (ref 22–32)
Calcium: 9.1 mg/dL (ref 8.9–10.3)
Chloride: 97 mmol/L — ABNORMAL LOW (ref 98–111)
Creatinine, Ser: 0.91 mg/dL (ref 0.61–1.24)
GFR, Estimated: >60 mL/min (ref >60)
Glucose, Bld: 92 mg/dL (ref 70–99)
Potassium: 3.1 mmol/L — ABNORMAL LOW (ref 3.5–5.1)
Sodium: 136 mmol/L (ref 135–145)
Total Bilirubin: 0.3 mg/dL (ref 0.3–1.2)
Total Protein: 8.1 g/dL (ref 6.5–8.1)

## 2022-09-09 LAB — CBC WITH DIFFERENTIAL/PLATELET
Abs Immature Granulocytes: 0.01 10*3/uL (ref 0.00–0.07)
Basophils Absolute: 0 10*3/uL (ref 0.0–0.1)
Basophils Relative: 1 %
Eosinophils Absolute: 0.1 10*3/uL (ref 0.0–0.5)
Eosinophils Relative: 1 %
HCT: 30.5 % — ABNORMAL LOW (ref 39.0–52.0)
Hemoglobin: 10.2 g/dL — ABNORMAL LOW (ref 13.0–17.0)
Immature Granulocytes: 0 %
Lymphocytes Relative: 36 %
Lymphs Abs: 2.3 10*3/uL (ref 0.7–4.0)
MCH: 30.5 pg (ref 26.0–34.0)
MCHC: 33.4 g/dL (ref 30.0–36.0)
MCV: 91.3 fL (ref 80.0–100.0)
Monocytes Absolute: 0.6 10*3/uL (ref 0.1–1.0)
Monocytes Relative: 9 %
Neutro Abs: 3.4 10*3/uL (ref 1.7–7.7)
Neutrophils Relative %: 53 %
Platelets: 207 10*3/uL (ref 150–400)
RBC: 3.34 MIL/uL — ABNORMAL LOW (ref 4.22–5.81)
RDW: 12.3 % (ref 11.5–15.5)
WBC: 6.5 10*3/uL (ref 4.0–10.5)
nRBC: 0 % (ref 0.0–0.2)

## 2022-09-09 LAB — RAPID URINE DRUG SCREEN, HOSP PERFORMED
Amphetamines: NOT DETECTED
Barbiturates: NOT DETECTED
Benzodiazepines: NOT DETECTED
Cocaine: NOT DETECTED
Opiates: NOT DETECTED
Tetrahydrocannabinol: POSITIVE — AB

## 2022-09-09 LAB — MRSA NEXT GEN BY PCR, NASAL: MRSA by PCR Next Gen: NOT DETECTED

## 2022-09-09 LAB — CK: Total CK: 1589 U/L — ABNORMAL HIGH (ref 49–397)

## 2022-09-09 MED ORDER — LORAZEPAM 1 MG PO TABS
2.0000 mg | ORAL_TABLET | Freq: Once | ORAL | Status: AC
  Administered 2022-09-09: 2 mg via ORAL
  Filled 2022-09-09: qty 2

## 2022-09-09 MED ORDER — DEXTROSE 50 % IV SOLN
12.5000 g | Freq: Once | INTRAVENOUS | Status: AC
  Administered 2022-09-09: 12.5 g via INTRAVENOUS

## 2022-09-09 MED ORDER — POTASSIUM CHLORIDE 20 MEQ PO PACK
40.0000 meq | PACK | Freq: Once | ORAL | Status: DC
  Filled 2022-09-09: qty 2

## 2022-09-09 MED ORDER — DEXTROSE 50 % IV SOLN
INTRAVENOUS | Status: AC
  Filled 2022-09-09: qty 50

## 2022-09-09 MED ORDER — CHLORHEXIDINE GLUCONATE CLOTH 2 % EX PADS
6.0000 | MEDICATED_PAD | Freq: Every day | CUTANEOUS | Status: DC
  Administered 2022-09-09 – 2022-09-15 (×7): 6 via TOPICAL

## 2022-09-09 MED ORDER — DEXTROSE-SODIUM CHLORIDE 5-0.9 % IV SOLN: INTRAVENOUS | Status: DC

## 2022-09-09 MED ORDER — ACETAMINOPHEN 325 MG PO TABS: 650.0000 mg | ORAL_TABLET | Freq: Four times a day (QID) | ORAL | Status: DC | PRN

## 2022-09-09 MED ORDER — LORAZEPAM 2 MG/ML IJ SOLN
1.0000 mg | Freq: Once | INTRAMUSCULAR | Status: AC
  Administered 2022-09-09: 1 mg via INTRAVENOUS
  Filled 2022-09-09: qty 1

## 2022-09-09 MED ORDER — DIPHENHYDRAMINE HCL 50 MG/ML IJ SOLN
25.0000 mg | Freq: Once | INTRAMUSCULAR | Status: AC
  Administered 2022-09-09: 25 mg via INTRAVENOUS
  Filled 2022-09-09: qty 1

## 2022-09-09 MED ORDER — ACETAMINOPHEN 650 MG RE SUPP: 650.0000 mg | Freq: Four times a day (QID) | RECTAL | Status: DC | PRN

## 2022-09-09 MED ORDER — POTASSIUM CHLORIDE 10 MEQ/100ML IV SOLN
10.0000 meq | INTRAVENOUS | Status: AC
  Administered 2022-09-09 (×4): 10 meq via INTRAVENOUS
  Filled 2022-09-09 (×4): qty 100

## 2022-09-09 MED ORDER — SODIUM CHLORIDE 0.9 % IV BOLUS
1000.0000 mL | Freq: Once | INTRAVENOUS | Status: AC
  Administered 2022-09-09: 1000 mL via INTRAVENOUS

## 2022-09-09 MED ORDER — ENOXAPARIN SODIUM 40 MG/0.4ML IJ SOSY
40.0000 mg | PREFILLED_SYRINGE | INTRAMUSCULAR | Status: DC
  Administered 2022-09-09 – 2022-09-15 (×6): 40 mg via SUBCUTANEOUS
  Filled 2022-09-09 (×6): qty 0.4

## 2022-09-09 MED ORDER — HALOPERIDOL 1 MG PO TABS: 5.0000 mg | ORAL_TABLET | Freq: Every day | ORAL | Status: DC

## 2022-09-09 MED ORDER — ALBUTEROL SULFATE (2.5 MG/3ML) 0.083% IN NEBU: 2.5000 mg | INHALATION_SOLUTION | RESPIRATORY_TRACT | Status: DC | PRN

## 2022-09-09 MED ORDER — DEXTROSE 50 % IV SOLN
INTRAVENOUS | Status: AC
  Administered 2022-09-09: 25 mL
  Filled 2022-09-09: qty 50

## 2022-09-09 MED ORDER — LORAZEPAM 1 MG PO TABS
1.0000 mg | ORAL_TABLET | ORAL | Status: DC | PRN
  Administered 2022-09-09 – 2022-09-10 (×5): 1 mg via ORAL
  Filled 2022-09-09 (×5): qty 1

## 2022-09-09 MED ORDER — TRAZODONE HCL 50 MG PO TABS
25.0000 mg | ORAL_TABLET | Freq: Every evening | ORAL | Status: DC | PRN
  Administered 2022-09-10: 25 mg via ORAL
  Filled 2022-09-09: qty 1

## 2022-09-09 MED ORDER — ONDANSETRON HCL 4 MG PO TABS: 4.0000 mg | ORAL_TABLET | Freq: Four times a day (QID) | ORAL | Status: DC | PRN

## 2022-09-09 MED ORDER — ORAL CARE MOUTH RINSE: 15.0000 mL | OROMUCOSAL | Status: DC | PRN

## 2022-09-09 MED ORDER — POTASSIUM CHLORIDE CRYS ER 20 MEQ PO TBCR: 40.0000 meq | EXTENDED_RELEASE_TABLET | Freq: Once | ORAL | Status: DC

## 2022-09-09 MED ORDER — ONDANSETRON HCL 4 MG/2ML IJ SOLN: 4.0000 mg | Freq: Four times a day (QID) | INTRAMUSCULAR | Status: DC | PRN

## 2022-09-09 MED ORDER — SODIUM CHLORIDE 0.9 % IV SOLN: INTRAVENOUS | Status: DC

## 2022-09-09 NOTE — ED Provider Notes (Signed)
Blood pressure 111/68, pulse 77, temperature 98.3 F (36.8 C), temperature source Oral, resp. rate 17, SpO2 100%.  Assuming care from Dr. Pilar Plate.  In short, Gary Bird is a 27 y.o. male with a chief complaint of Medication Reaction .  Refer to the original H&P for additional details.  The current plan of care is to admit.   10:05 AM  Called to patient bedside.  He has removed his IV and now wishes for discharge.  On my assessment this morning does not appear to have full capacity to make the decision to and AGAINST MEDICAL ADVICE.  Mom spoke with his mom on the phone who spoke with him and was able to convince him to voluntarily continue with plan for admission.  She plans to visit him at [facility] when he arrives.  During our conversation, [facility] arrives at bedside.  Discussed with him that he remains voluntary but he is agreeable to receiving some benzodiazepine for anxiety and continue with admission.     Maia Plan, MD 09/09/22 1010

## 2022-09-09 NOTE — Progress Notes (Addendum)
Plan of Care Note for accepted transfer  Patient: Gary Bird              OHY:[id number]  DOA: 09/08/2022     Facility requesting transfer: [facility]-emergency department Requesting Provider: Kennis Carina, MD  Reason for transfer: Management for neuroleptic malignant syndrome and need formal psychiatry evaluation  Facility course: Gary Bird is a 27 y.o. male with medical history significant of  ADHD, Depression, Paranoid schizophrenia , HIV and syphilis presented to emergency department with complaining of medication reaction.  Patient has been recently admitted on 09/06/2022 and left AMA for neuroleptic malignant syndrome.  At bedside patient mom is very frustrated.  This time patient presenting to ED with complaining of shakiness, sweating and not feeling very well.  At presentation to ED heart rate 102, respiratory rate 23, blood pressure 119/91 and O2 sat 100% room air.  CMP grossly unremarkable except low potassium 3.1, low chloride 97 and chronically elevated AST/ALT. CBC unremarkable except evidence of chronic anemia globin 10.1 hematocrit 30.5. Elevated CK 1589 which has been improved from 8/13 when the CK was 2384. Blood alcohol level less than 10. EKG shows sinus rhythm heart rate 93. In the ED patient has been given Benadryl 25 mg once, Ativan 1 mg once and 1 L of NS bolus.  Hospitalist has been consulted for admission for ongoing management of neuroleptic malignant syndrome and rhabdomyolysis.  Per chart review of note patient was admitted on 09/06/2022 for the management of NMS in the setting of antipsychotic symptoms due to high dose of Haldol 200 mg IV in conjunction with oral Haldol.  Patient improved with Ativan.  During that time patient was evaluated by ICU recommended medical management and hold any antipsychotic medication.  Can use benzodiazepine for agitation as needed.  Patient supposed to be evaluated by psychiatry however he left AMA on 09/06/2022 at 11 PM.  Plan of  care: Will continue to hold any oral antipsychotic medication. Upon arrival to facility patient needs formal psychiatry evaluation.  The patient is accepted for admission to [facility] unit, at [facility] or [facility].  Check [email or web address] for on-call coverage.  [facility] will assume care on arrival to accepting facility. Until arrival, medical decision making responsibilities remain with the EDP.  However, [facility] available 24/7 for questions and assistance.   Nursing staff please page [facility] Admits and Consults [phone]) as soon as the patient arrives to the hospital.    Author: Tereasa Coop, MD  09/09/2022  [facility]

## 2022-09-09 NOTE — H&P (Addendum)
History and Physical  BRAYLYN RAPIER ZOX:[id number] DOB: 15-Dec-1995 DOA: 09/08/2022  PCP: Patient, No Pcp Per   Chief Complaint: tremor   HPI: Gary Bird is a 27 y.o. male with medical history significant for untreated HIV, paranoid schizophrenia being admitted to the hospital with altered mental status and tremors.  He was actually admitted to [facility] on 8/13 with same complaints.  It seems that he has been noncompliant with his medications since February 2023, and unfortunately has had progressive decompensation of his schizophrenia.  He was IVC on 8/4 and hospitalized until 8/9 in the behavioral health unit at [facility].  On discharge, patient was given 200 mg IM Haldol Depo shot and continued on home p.o. Haldol.  Per history and physical on 8/13, patient's mother noted that the patient seemed very confused/lethargic and had episodes of staring spells, as well as shaking associated with diaphoresis.  She then brought him to the ER for evaluation on that day.  Then today, he was noted to have mild hypotension and labs notable for CPK 1232.  He was admitted to the hospitalist service, started on IV fluids.  Later that evening, patient left the hospital AGAINST MEDICAL ADVICE, hospitalist on-call documented that the patient seemed to be competent to make this decision, and his mother was also present at the time.  Patient returned to [facility] this morning with complaints of continued shakiness, sweating and not feeling well.  In the emergency department, he was somewhat hypotensive, but blood pressures are improved.  He was also tachycardic but this is improved as well.  He was given p.o. Ativan.  Lab work was repeated, significant for stable anemia, potassium 3.1, normal BUN and creatinine.  CPK today 1589.  He was accepted to the hospitalist service for further management of concern for drug reaction and possible neuroleptic malignant syndrome.  Was able to speak  to his mother on the phone, she corroborated the above, denies any other new symptoms or concerns.  Feels like his tremoring and mental status has been stable since 8/13 no significant improvement or change.  States that he was on a monthly shot as an outpatient with behavioral health through [facility], but does not know what that was.  Review of Systems: Please see HPI for pertinent positives and negatives. A complete 10 system review of systems are otherwise negative.  Past Medical History:  Diagnosis Date   ADHD (attention deficit hyperactivity disorder)    Depression    HIV positive (HCC)    Paranoid schizophrenia (HCC)    Past Surgical History:  Procedure Laterality Date   ADENOIDECTOMY W/ MYRINGOTOMY      Social History:  reports that he has been smoking cigarettes. He has never used smokeless tobacco. He reports current alcohol use. He reports that he does not use drugs.   Allergies  Allergen Reactions   Latex Rash    History reviewed. No pertinent family history.   Prior to Admission medications   Medication Sig Start Date End Date Taking? Authorizing Provider  elvitegravir-cobicistat-emtricitabine-tenofovir (GENVOYA) 150-150-200-10 MG TABS tablet Take 1 tablet by mouth daily with breakfast.   Yes [provider]  haloperidol (HALDOL) 5 MG tablet Take 5 mg by mouth at bedtime. 09/04/22  Yes [provider]  hydrOXYzine (VISTARIL) 25 MG capsule Take 25 mg by mouth daily as needed for anxiety. 09/04/22  Yes [provider]  metoCLOPramide (REGLAN) 10 MG tablet Take 1 tablet (10 mg total) by mouth  every 6 (six) hours as needed for nausea or vomiting. Patient not taking: Reported on 09/09/2022 02/16/18   Molpus, Jonny Ruiz, MD    Physical Exam: BP 111/68   Pulse 77   Temp 98.3 F (36.8 C) (Oral)   Resp 17   SpO2 100%   General:  Alert, orientedx4, calm, in no acute distress, pleasant and cooperative, somewhat anxious in appearance and has generalized  tremor, both at rest and with intentional movement Eyes: EOMI, clear conjuctivae, white sclerea Neck: supple, no masses, trachea mildline  Cardiovascular: RRR, no murmurs or rubs, no peripheral edema  Respiratory: clear to auscultation bilaterally, no wheezes, no crackles  Abdomen: soft, nontender, nondistended, normal bowel tones heard  Skin: dry, no rashes  Musculoskeletal: no joint effusions, normal range of motion  Psychiatric: appropriate affect, normal speech, slightly anxious in appearance, somewhat slow to respond to questions, speaking softly but oriented x 4 Neurologic: extraocular muscles intact, clear speech, moving all extremities with intact sensorium, generalized tremor which seems to worsen as he appears to get slightly more anxious         Labs on Admission:  Basic Metabolic Panel: Recent Labs  Lab 09/06/22 0108 09/06/22 1347 09/08/22 2300  NA 138 136 136  K 3.4* 3.5 3.1*  CL 100 104 97*  CO2 24 25 28   GLUCOSE 106* 84 92  BUN 10 8 6   CREATININE 1.14 0.97 0.91  CALCIUM 10.0 8.4* 9.1  MG  --  2.0  --   PHOS  --  3.4  --    Liver Function Tests: Recent Labs  Lab 09/06/22 0108 09/06/22 1347 09/08/22 2300  AST 39 55* 53*  ALT 16 20 23   ALKPHOS 29* 23* 33*  BILITOT 0.2* 0.6 0.3  PROT 8.6* 6.5 8.1  ALBUMIN 4.2 3.1* 4.1   No results for input(s): "LIPASE", "AMYLASE" in the last 168 hours. Recent Labs  Lab 09/06/22 1347  AMMONIA 23   CBC: Recent Labs  Lab 09/06/22 0108 09/06/22 1347 09/08/22 2300  WBC 5.7 4.5 6.5  NEUTROABS  --   --  3.4  HGB 11.5* 9.5* 10.2*  HCT 34.5* 28.5* 30.5*  MCV 90.3 91.1 91.3  PLT 217 168 207   Cardiac Enzymes: Recent Labs  Lab 09/06/22 0108 09/06/22 1347 09/08/22 2300  CKTOTAL 1,232* 2,384* 1,589*    BNP (last 3 results) No results for input(s): "BNP" in the last 8760 hours.  ProBNP (last 3 results) No results for input(s): "PROBNP" in the last 8760 hours.  CBG: Recent Labs  Lab 09/06/22 0024  GLUCAP  124*    Radiological Exams on Admission: No results found.  Assessment/Plan This is a 27 year old man with a history of paranoid schizophrenia, ADHD, depression, HIV who has been off of medication since 02/2021 until he was recently admitted to behavioral health unit at [facility] and discharged with Haldol Depo.  Since then, he has been intermittently catatonic, having tremors with elevated CPK and being admitted for management of this.  AMS and tremors-concerns for possible neuroleptic malignant syndrome, however I find this to be somewhat unlikely given the lack of significant rigidity, fevers, and only mildly elevated CPK.  His somnolence etc. may well be an effect of his Haldol, I suspect tremors may be psychogenic as they worsen as they are discussed.  This was also noted by psychiatry consulted on 8/13. -Inpatient admission -Can likely be transferred to progressive unit in the next 24 hours if remains stable and afebrile -Inpatient psych consult  requested -Though NMS is not highly suspected, will continue to treat with hydration, close monitoring, and Ativan as needed -Will avoid further Haldol at this time, though Depo shot will remain in his system for some time -Will request records from [facility] behavioral health in [facility] -Could consider formal inpatient neurology consultation if tremors continue or symptoms worsen, though no clear indication for this currently  Elevated CPK-could be due to continued tremor, no history or evidence of trauma, note normal renal function -Continue supportive care with IV fluids -Follow CPK with daily labs  Hypokalemia-will replete orally and recheck with  History of HIV-untreated since 02/2021 -Will need outpatient ID follow-up  DVT prophylaxis: Lovenox     Code Status: Full Code  Consults called: None  Admission status: The appropriate patient status for this patient is INPATIENT. Inpatient status is judged to be reasonable and  necessary in order to provide the required intensity of service to ensure the patient's safety. The patient's presenting symptoms, physical exam findings, and initial radiographic and laboratory data in the context of their chronic comorbidities is felt to place them at high risk for further clinical deterioration. Furthermore, it is not anticipated that the patient will be medically stable for discharge from the hospital within 2 midnights of admission.    I certify that at the point of admission it is my clinical judgment that the patient will require inpatient hospital care spanning beyond 2 midnights from the point of admission due to high intensity of service, high risk for further deterioration and high frequency of surveillance required  Time spent: 53 minutes  Trevonne Nyland Sharlette Dense MD [facility] Pager [phone]  If 7PM-7AM, please contact night-coverage [email or web address] Password [facility]  09/09/2022, 10:58 AM

## 2022-09-09 NOTE — ED Notes (Addendum)
Pt noted to be walking up to nurses desk. Pt walking towards exit. Pt was encouraged to proceed back to his room. Pt pulled it IV out upon returning. EDP made aware. Pt requested to speak with his mother. This Clinical research associate called pt mother and spoke with her. Pt also spoke with mom. Mom encouraged pt to remain in the hosptial for transport to [facility]. EDP spoke with pt and pt verbally agreed. Mother states that she told pt to stay and that ED staff were there to take care of her. EDP gave order for PO Ativan and IV site clean and dry. [facility] at bedside and is ready to transport pt to [facility].

## 2022-09-09 NOTE — ED Provider Notes (Signed)
[facility]-EMERGENCY DEPT [facility] Emergency Department Provider Note MRN:  [id number]  Arrival date & time: 09/09/22     Chief Complaint   Medication Reaction   History of Present Illness   Gary Bird is a 27 y.o. year-old male with a history of paranoid schizophrenia presenting to the ED with chief complaint of medication reaction.  Patient was recently admitted for possible neuroleptic malignant syndrome.  Left the hospital AGAINST MEDICAL ADVICE.  Here with mom who is frustrated by this.  Still having sweatiness, shakes, doing a bit better but still not doing well.  Review of Systems  A thorough review of systems was obtained and all systems are negative except as noted in the HPI and PMH.   Patient's Health History    Past Medical History:  Diagnosis Date   ADHD (attention deficit hyperactivity disorder)    Depression    HIV positive (HCC)    Paranoid schizophrenia (HCC)     Past Surgical History:  Procedure Laterality Date   ADENOIDECTOMY W/ MYRINGOTOMY      History reviewed. No pertinent family history.  Social History   Socioeconomic History   Marital status: Single    Spouse name: Not on file   Number of children: Not on file   Years of education: Not on file   Highest education level: Not on file  Occupational History   Not on file  Tobacco Use   Smoking status: Every Day    Types: Cigarettes   Smokeless tobacco: Never  Vaping Use   Vaping status: Some Days  Substance and Sexual Activity   Alcohol use: Yes    Comment: occ   Drug use: No   Sexual activity: Not on file  Other Topics Concern   Not on file  Social History Narrative   Not on file   Social Determinants of Health   Financial Resource Strain: Not on file  Food Insecurity: Not on file  Transportation Needs: Not on file  Physical Activity: Not on file  Stress: Not on file  Social Connections: Not on file  Intimate Partner Violence: Not on file     Physical Exam    Vitals:   09/09/22 0200 09/09/22 0230  BP: (!) 129/92 106/62  Pulse:  68  Resp: (!) 33 (!) 8  Temp:    SpO2:  98%    CONSTITUTIONAL: Chronically ill-appearing, NAD NEURO/PSYCH: Staring straight ahead, slow to respond, feels warm and sweaty, muscle rigidity, tremulousness EYES:  eyes equal and reactive ENT/NECK:  no LAD, no JVD CARDIO: Regular rate, well-perfused, normal S1 and S2 PULM:  CTAB no wheezing or rhonchi GI/GU:  non-distended, non-tender MSK/SPINE:  No gross deformities, no edema SKIN:  no rash, atraumatic   *Additional and/or pertinent findings included in MDM below  Diagnostic and Interventional Summary    EKG Interpretation Date/Time:  Friday September 09 2022 00:00:52 EDT Ventricular Rate:  93 PR Interval:  152 QRS Duration:  105 QT Interval:  357 QTC Calculation: 444 R Axis:   47  Text Interpretation: Sinus rhythm Left atrial enlargement RSR' in V1 or V2, right VCD or RVH Artifact in lead(s) I II III aVR aVL aVF V1 V2 and baseline wander in lead(s) II III aVR aVF V4 V5 Confirmed by Kennis Carina [phone]) on 09/09/2022 2:38:36 AM       Labs Reviewed  COMPREHENSIVE METABOLIC PANEL - Abnormal; Notable for the following components:      Result Value   Potassium 3.1 (*)  Chloride 97 (*)    AST 53 (*)    Alkaline Phosphatase 33 (*)    All other components within normal limits  CBC WITH DIFFERENTIAL/PLATELET - Abnormal; Notable for the following components:   RBC 3.34 (*)    Hemoglobin 10.2 (*)    HCT 30.5 (*)    All other components within normal limits  CK - Abnormal; Notable for the following components:   Total CK 1,589 (*)    All other components within normal limits  ETHANOL  RAPID URINE DRUG SCREEN, HOSP PERFORMED    No orders to display    Medications  diphenhydrAMINE (BENADRYL) injection 25 mg (25 mg Intravenous Given 09/09/22 0022)  sodium chloride 0.9 % bolus 1,000 mL (0 mLs Intravenous Stopped 09/09/22 0214)  LORazepam (ATIVAN) injection  1 mg (1 mg Intravenous Given 09/09/22 0208)     Procedures  /  Critical Care Procedures  ED Course and Medical Decision Making  Initial Impression and Ddx Similar presentation compared to a couple days ago, during which I cared for the patient.  Seems a bit better cognitively per mom.  Vital signs normal, will recheck labs, monitor closely.  I do not think he has had any more antipsychotics since I saw him last.  Past medical/surgical history that increases complexity of ED encounter: Paranoid schizophrenia  Interpretation of Diagnostics I personally reviewed the EKG and my interpretation is as follows: Sinus rhythm  Labs overall reassuring with no significant blood count or electrolyte disturbance.  CK still elevated but downtrending  Patient Reassessment and Ultimate Disposition/Management     Plan is for admission given patient's continued tremulous and diaphoretic behaviors, continued concern for possible NMS.  Patient management required discussion with the following services or consulting groups:  Hospitalist Service  Complexity of Problems Addressed Acute illness or injury that poses threat of life of bodily function  Additional Data Reviewed and Analyzed Further history obtained from: Further history from spouse/family member  Additional Factors Impacting ED Encounter Risk Consideration of hospitalization  Elmer Sow. Pilar Plate, MD [facility] Emergency Medicine [facility] [email or web address]  Final Clinical Impressions(s) / ED Diagnoses     ICD-10-CM   1. Tremulousness  R25.1       ED Discharge Orders     None        Discharge Instructions Discussed with and Provided to Patient:   Discharge Instructions   None      Sabas Sous, MD 09/09/22 [phone]

## 2022-09-09 NOTE — ED Notes (Signed)
Per Pilar Plate, MD: notify physician if Blood Pressure MAP drops below 55.

## 2022-09-09 NOTE — Plan of Care (Signed)
Pt has not been able to participate in learning or care since arrival due to current status. Will continue to assess for changes and educate as appropriate.

## 2022-09-09 NOTE — Consult Note (Signed)
  Obtunded and asleep. The patient is a thin appearing younger appearing African-American male. This patient has presented x2 this week for similar complaints of NMS following a haldol deconate injection that was administered days prior to discharge. The patient was started on haldol 10mg  po BID and tolerated well, then received the Haldol dec on 09/01/2022.  prior to oral tolerance and went 18 months without his medications.  The patient presented to [place], and left AMA prior to being assessed completely by psychiatry.   Today on exam he was obtunded, difficult to arouse and found it difficult to stay awake. Nurse reported he received 2mg  of ativan prior to transport.Psych consult placed for possible NMS reaction to Haldol deconate. Patient unable to participate in psychiatric evaluation. This provider was able to do a physical exam to assess for rigidity which there was none of exam. Every attempt to assess the patient extremities was met with limp and cold extremities.   Vital signs were fairly abnormal he would cycle between 40s and 60s, low normotensive blood pressure. Provider assisted nurse with IV placement in R bicep, in order to help gain an additional line for emergencies considering he was bradycardic, cold, obtunded and limp. In regards to his NMS, while he may have risk factors of NMS, his symptoms remain low at this time. He has history of stimulant use disorder in 2022/2023, that resulted in admission and making his diagnosis of schizophrenia a little unclear. At that time he was taking Invega sustenna. He stopped his medications due to galactorrhea.  Chart review does not validate this diagnosis going back into 2011, where he had multiple inpatient hospital admissions as a teen for oppositional behaviors. Otherwise there is only one known episode of psychosis that is documented.   -Psychiatry will continue to follow at this time.  -Considering patient left AMA on Monday prior to  clearance, will complete IVC paperwork and leave in the front of his chart. Provider will need to sign and date paperwork, if he attempts to leave.  -Haldol deconate is a 28day long acting injectable with no antidote, unfortunately for this young man he may continue to display symptoms of NMS throughout the duration of this injection. CK continues to rise slowly and will need to be monitored for worsening symptoms.  - Strongly encourage ongoing monitoring and searching for additional organic causes of medications.  -Continue to treat with benzodiazapines and frequent monitoring. EKG obtained on 08/16, QT 458.Patient remains at risk for QTc prolongation.

## 2022-09-10 DIAGNOSIS — G210 Malignant neuroleptic syndrome: Secondary | ICD-10-CM | POA: Diagnosis not present

## 2022-09-10 DIAGNOSIS — G259 Extrapyramidal and movement disorder, unspecified: Secondary | ICD-10-CM | POA: Diagnosis present

## 2022-09-10 LAB — CBC
HCT: 27.4 % — ABNORMAL LOW (ref 39.0–52.0)
Hemoglobin: 8.9 g/dL — ABNORMAL LOW (ref 13.0–17.0)
MCH: 31.1 pg (ref 26.0–34.0)
MCHC: 32.5 g/dL (ref 30.0–36.0)
MCV: 95.8 fL (ref 80.0–100.0)
Platelets: 180 10*3/uL (ref 150–400)
RBC: 2.86 MIL/uL — ABNORMAL LOW (ref 4.22–5.81)
RDW: 12.4 % (ref 11.5–15.5)
WBC: 4.9 10*3/uL (ref 4.0–10.5)
nRBC: 0 % (ref 0.0–0.2)

## 2022-09-10 LAB — BASIC METABOLIC PANEL
Anion gap: 6 (ref 5–15)
BUN: 6 mg/dL (ref 6–20)
CO2: 27 mmol/L (ref 22–32)
Calcium: 8.1 mg/dL — ABNORMAL LOW (ref 8.9–10.3)
Chloride: 103 mmol/L (ref 98–111)
Creatinine, Ser: 0.94 mg/dL (ref 0.61–1.24)
GFR, Estimated: >60 mL/min (ref >60)
Glucose, Bld: 86 mg/dL (ref 70–99)
Potassium: 3.2 mmol/L — ABNORMAL LOW (ref 3.5–5.1)
Sodium: 136 mmol/L (ref 135–145)

## 2022-09-10 LAB — GLUCOSE, CAPILLARY
Glucose-Capillary: 155 mg/dL — ABNORMAL HIGH (ref 70–99)
Glucose-Capillary: 69 mg/dL — ABNORMAL LOW (ref 70–99)
Glucose-Capillary: 76 mg/dL (ref 70–99)
Glucose-Capillary: 81 mg/dL (ref 70–99)
Glucose-Capillary: 73 mg/dL (ref 70–99)
Glucose-Capillary: 86 mg/dL (ref 70–99)

## 2022-09-10 LAB — CK: Total CK: 1064 U/L — ABNORMAL HIGH (ref 49–397)

## 2022-09-10 LAB — AMMONIA: Ammonia: 18 umol/L (ref 9–35)

## 2022-09-10 LAB — VITAMIN B12: Vitamin B-12: 191 pg/mL (ref 180–914)

## 2022-09-10 MED ORDER — GUAIFENESIN 100 MG/5ML PO LIQD: 5.0000 mL | ORAL | Status: DC | PRN

## 2022-09-10 MED ORDER — LORAZEPAM 1 MG PO TABS
1.0000 mg | ORAL_TABLET | Freq: Three times a day (TID) | ORAL | Status: AC
  Administered 2022-09-10 – 2022-09-12 (×6): 1 mg via ORAL
  Filled 2022-09-10 (×6): qty 1

## 2022-09-10 MED ORDER — SENNOSIDES-DOCUSATE SODIUM 8.6-50 MG PO TABS: 1.0000 | ORAL_TABLET | Freq: Every evening | ORAL | Status: DC | PRN

## 2022-09-10 MED ORDER — METOPROLOL TARTRATE 5 MG/5ML IV SOLN: 5.0000 mg | INTRAVENOUS | Status: DC | PRN

## 2022-09-10 MED ORDER — SODIUM CHLORIDE 0.9 % IV BOLUS
1000.0000 mL | Freq: Once | INTRAVENOUS | Status: AC
  Administered 2022-09-10: 1000 mL via INTRAVENOUS

## 2022-09-10 MED ORDER — LORAZEPAM 2 MG/ML IJ SOLN
1.0000 mg | Freq: Once | INTRAMUSCULAR | Status: AC
  Administered 2022-09-10: 1 mg via INTRAVENOUS
  Filled 2022-09-10: qty 1

## 2022-09-10 MED ORDER — IPRATROPIUM-ALBUTEROL 0.5-2.5 (3) MG/3ML IN SOLN: 3.0000 mL | RESPIRATORY_TRACT | Status: DC | PRN

## 2022-09-10 MED ORDER — DEXTROSE 50 % IV SOLN
INTRAVENOUS | Status: AC
  Administered 2022-09-10: 25 mL
  Filled 2022-09-10: qty 50

## 2022-09-10 MED ORDER — POTASSIUM CHLORIDE CRYS ER 20 MEQ PO TBCR
40.0000 meq | EXTENDED_RELEASE_TABLET | Freq: Once | ORAL | Status: AC
  Administered 2022-09-10: 40 meq via ORAL
  Filled 2022-09-10: qty 2

## 2022-09-10 MED ORDER — HYDRALAZINE HCL 20 MG/ML IJ SOLN: 10.0000 mg | INTRAMUSCULAR | Status: DC | PRN

## 2022-09-10 MED ORDER — BENZTROPINE MESYLATE 1 MG PO TABS
1.0000 mg | ORAL_TABLET | Freq: Two times a day (BID) | ORAL | Status: DC
  Administered 2022-09-10 – 2022-09-15 (×10): 1 mg via ORAL
  Filled 2022-09-10: qty 2
  Filled 2022-09-10: qty 1
  Filled 2022-09-10 (×4): qty 2
  Filled 2022-09-10: qty 1
  Filled 2022-09-10 (×3): qty 2
  Filled 2022-09-10: qty 1

## 2022-09-10 NOTE — Progress Notes (Signed)
Patient began experiencing increased agitation and tremors despite medications prescribed. Began climbing out of bed and stating that he "wants to go home." Patient is not redirectable and it took 5 staff members to keep in bed. Paged attending providers. Given one time dose of medication as prescribed (see MAR).  IVC placed on chart, pending provider and notary signature if patient attempts to leave again. AC and charge aware. Patient is not capable of safely making his own decisions at this time. Additional sedating medications effective, do not feel restraints would be appropriate for this patient at this time  Talbert Forest, RN

## 2022-09-10 NOTE — Consult Note (Addendum)
  09/10/22: progress note Patient seen face to face in his hospital room, his mother at bedside. He is 27 year old male who brought to the hospital for treatment of extrapyramidal symptoms secondary to Haldol deconate injection that was administered days prior to discharge from [facility] where he was hospitalized from August 4 th till 8th.  Patient is a poor historian, but collateral information obtained from his mother revealed that he has a long history of Schizophrenia with poor compliance with medications. She states that patient was off his medications since February, 2023 before it was restarted on August 4 th, 2024 Today on exam he appears lethargic, but easily arousable but unable to put together his symptoms in a coherent pattern. Patient continues to have bilateral hand tremors, repetitive body movement, difficulty speaking and abnormal posture. 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  -EKG obtained on 08/16, QT 458.Patient remains at risk for QTc prolongation which warrant EKG monitoring - Psychiatry will continue to follow at this time.

## 2022-09-10 NOTE — Plan of Care (Signed)
Pt. not cognitively ready for learning   Problem: Education: Goal: Knowledge of General Education information will improve Description: Including pain rating scale, medication(s)/side effects and non-pharmacologic comfort measures Outcome: Not Progressing   Problem: Health Behavior/Discharge Planning: Goal: Ability to manage health-related needs will improve Outcome: Not Progressing   Problem: Clinical Measurements: Goal: Ability to maintain clinical measurements within normal limits will improve Outcome: Not Progressing Goal: Will remain free from infection Outcome: Not Progressing Goal: Diagnostic test results will improve Outcome: Not Progressing Goal: Respiratory complications will improve Outcome: Not Progressing Goal: Cardiovascular complication will be avoided Outcome: Not Progressing   Problem: Activity: Goal: Risk for activity intolerance will decrease Outcome: Not Progressing   Problem: Nutrition: Goal: Adequate nutrition will be maintained Outcome: Not Progressing   Problem: Coping: Goal: Level of anxiety will decrease Outcome: Not Progressing   Problem: Elimination: Goal: Will not experience complications related to bowel motility Outcome: Not Progressing Goal: Will not experience complications related to urinary retention Outcome: Not Progressing   Problem: Pain Managment: Goal: General experience of comfort will improve Outcome: Not Progressing   Problem: Safety: Goal: Ability to remain free from injury will improve Outcome: Not Progressing   Problem: Skin Integrity: Goal: Risk for impaired skin integrity will decrease Outcome: Not Progressing

## 2022-09-10 NOTE — Plan of Care (Signed)

## 2022-09-10 NOTE — Progress Notes (Signed)
Tremors:  2020-2130 [phone]  Ativan is helping some. He is still pretty obtunded. He knows his name, but he thinks he is at [facility].

## 2022-09-10 NOTE — Progress Notes (Signed)
PROGRESS NOTE    Gary Bird  WUJ:[id number] DOB: [birth year] DOA: 09/08/2022 PCP: Patient, No Pcp Per   Brief Narrative:  27 year old with history of untreated HIV, ADHD, depression, history of syphilis, paranoid schizophrenia comes to the hospital with altered mental status and tremors.  He was admitted to [facility] on 8/13 for same complaints.  He has been noncompliant with his HIV medications for several months now.  He was IVC on 8/4 and hospitalized until 8/9 in behavioral unit in [facility].  On discharge she was given 200 mg of IM Haldol Depo shot.  On the day of admission mother noted that he was very confused lethargic with some staring spell therefore brought him to the hospital.  He was noted to be hypotensive with an rhabdomyolysis.  Admitted to the hospital where he ended up leaving AMA.  Presented again to med [facility] with shaking and similar complaints and rhabdomyolysis and eventually admitted back again to the hospital.   Assessment & Plan:  Principal Problem:   Neuroleptic malignant syndrome    Altered mental status and tremors - Concerns that this could be medication side effect, neuroleptic malignant syndrome.  At this time no obvious evidence of fevers or rigidity.  Psychiatry team has been consulted.  He will likely need inpatient psych. -UA -8/13.  UDS positive for THC. - Check TSH, B12, folate, ammonia  Rhabdomyolysis -Admission CPK 1589, slowly improving.   Hypokalemia -Replete   History of HIV Untreated since February 2023.  Will need outpatient follow-up.    DVT prophylaxis: enoxaparin (LOVENOX) injection 40 mg Start: 09/09/22 1200 SCDs Start: 09/09/22 1058 Code Status: Full code Family Communication:   Continue hospital stay until seen by psychiatry.       Diet Orders (From admission, onward)     Start     Ordered   09/09/22 1057  Diet regular Fluid consistency: Thin  Diet effective now       Question:  Fluid consistency:   Answer:  Thin   09/09/22 1056            Subjective: Seen at bedside patient able to answer basic questions.  No obvious rigidity, fevers and chills noted.  Tolerating orals   Examination:  General exam: Appears calm and comfortable  Respiratory system: Clear to auscultation. Respiratory effort normal. Cardiovascular system: S1 & S2 heard, RRR. No JVD, murmurs, rubs, gallops or clicks. No pedal edema. Gastrointestinal system: Abdomen is nondistended, soft and nontender. No organomegaly or masses felt. Normal bowel sounds heard. Central nervous system: Alert and oriented. No focal neurological deficits. Extremities: Symmetric 5 x 5 power. Skin: No rashes, lesions or ulcers Psychiatry: Judgement and insight appear slightly poor  Objective: Vitals:   09/10/22 0400 09/10/22 0500 09/10/22 0600 09/10/22 0700  BP: (!) 111/59 (!) 92/44 (!) 88/49 99/67  Pulse: 69 (!) 57 (!) 56 (!) 54  Resp: 13 12 11 11   Temp:      TempSrc:      SpO2: 100% 100% 99% 100%  Weight:      Height:        Intake/Output Summary (Last 24 hours) at 09/10/2022 0748 Last data filed at 09/10/2022 0342 Gross per 24 hour  Intake 1438.67 ml  Output 350 ml  Net 1088.67 ml   Filed Weights   09/09/22 1557  Weight: 59.6 kg    Scheduled Meds:  Chlorhexidine Gluconate Cloth  6 each Topical Daily   enoxaparin (LOVENOX) injection  40 mg Subcutaneous Q24H  potassium chloride  40 mEq Oral Once   Continuous Infusions:  sodium chloride 75 mL/hr at 09/10/22 0342   dextrose 5 % and 0.9 % NaCl 50 mL/hr at 09/10/22 0342    Nutritional status     Body mass index is 21.87 kg/m.  Data Reviewed:   CBC: Recent Labs  Lab 09/06/22 0108 09/06/22 1347 09/08/22 2300 09/10/22 0243  WBC 5.7 4.5 6.5 4.9  NEUTROABS  --   --  3.4  --   HGB 11.5* 9.5* 10.2* 8.9*  HCT 34.5* 28.5* 30.5* 27.4*  MCV 90.3 91.1 91.3 95.8  PLT 217 168 207 180   Basic Metabolic Panel: Recent Labs  Lab 09/06/22 0108 09/06/22 1347  09/08/22 2300 09/10/22 0243  NA 138 136 136 136  K 3.4* 3.5 3.1* 3.2*  CL 100 104 97* 103  CO2 24 25 28 27   GLUCOSE 106* 84 92 86  BUN 10 8 6 6   CREATININE 1.14 0.97 0.91 0.94  CALCIUM 10.0 8.4* 9.1 8.1*  MG  --  2.0  --   --   PHOS  --  3.4  --   --    GFR: Estimated Creatinine Clearance: 99.5 mL/min (by C-G formula based on SCr of 0.94 mg/dL). Liver Function Tests: Recent Labs  Lab 09/06/22 0108 09/06/22 1347 09/08/22 2300  AST 39 55* 53*  ALT 16 20 23   ALKPHOS 29* 23* 33*  BILITOT 0.2* 0.6 0.3  PROT 8.6* 6.5 8.1  ALBUMIN 4.2 3.1* 4.1   No results for input(s): "LIPASE", "AMYLASE" in the last 168 hours. Recent Labs  Lab 09/06/22 1347  AMMONIA 23   Coagulation Profile: No results for input(s): "INR", "PROTIME" in the last 168 hours. Cardiac Enzymes: Recent Labs  Lab 09/06/22 0108 09/06/22 1347 09/08/22 2300 09/10/22 0243  CKTOTAL 1,232* 2,384* 1,589* 1,064*   BNP (last 3 results) No results for input(s): "PROBNP" in the last 8760 hours. HbA1C: No results for input(s): "HGBA1C" in the last 72 hours. CBG: Recent Labs  Lab 09/09/22 2033 09/09/22 2052 09/09/22 2334 09/10/22 0347 09/10/22 0408  GLUCAP 68* 140* 92 69* 155*   Lipid Profile: No results for input(s): "CHOL", "HDL", "LDLCALC", "TRIG", "CHOLHDL", "LDLDIRECT" in the last 72 hours. Thyroid Function Tests: No results for input(s): "TSH", "T4TOTAL", "FREET4", "T3FREE", "THYROIDAB" in the last 72 hours. Anemia Panel: No results for input(s): "VITAMINB12", "FOLATE", "FERRITIN", "TIBC", "IRON", "RETICCTPCT" in the last 72 hours. Sepsis Labs: Recent Labs  Lab 09/06/22 1632 09/06/22 1748  LATICACIDVEN 1.0 1.1    Recent Results (from the past 240 hour(s))  MRSA Next Gen by PCR, Nasal     Status: None   Collection Time: 09/09/22 11:17 AM   Specimen: Nasal Mucosa; Nasal Swab  Result Value Ref Range Status   MRSA by PCR Next Gen NOT DETECTED NOT DETECTED Final    Comment: (NOTE) The GeneXpert  MRSA Assay (FDA approved for NASAL specimens only), is one component of a comprehensive MRSA colonization surveillance program. It is not intended to diagnose MRSA infection nor to guide or monitor treatment for MRSA infections. Test performance is not FDA approved in patients less than 14 years old. Performed at [facility], [address], [place], [place] [address]          Radiology Studies: No results found.         LOS: 1 day   Time spent= 35 mins    Miguel Rota, MD [facility]  If 7PM-7AM, please contact  night-coverage  09/10/2022, 7:48 AM

## 2022-09-11 ENCOUNTER — Inpatient Hospital Stay (HOSPITAL_COMMUNITY): Payer: MEDICAID

## 2022-09-11 DIAGNOSIS — G210 Malignant neuroleptic syndrome: Secondary | ICD-10-CM | POA: Diagnosis not present

## 2022-09-11 LAB — BASIC METABOLIC PANEL
Anion gap: 6 (ref 5–15)
BUN: <5 mg/dL — ABNORMAL LOW (ref 6–20)
CO2: 24 mmol/L (ref 22–32)
Calcium: 8.5 mg/dL — ABNORMAL LOW (ref 8.9–10.3)
Chloride: 107 mmol/L (ref 98–111)
Creatinine, Ser: 0.92 mg/dL (ref 0.61–1.24)
GFR, Estimated: >60 mL/min (ref >60)
Glucose, Bld: 90 mg/dL (ref 70–99)
Potassium: 3.4 mmol/L — ABNORMAL LOW (ref 3.5–5.1)
Sodium: 137 mmol/L (ref 135–145)

## 2022-09-11 LAB — GLUCOSE, CAPILLARY
Glucose-Capillary: 74 mg/dL (ref 70–99)
Glucose-Capillary: 76 mg/dL (ref 70–99)
Glucose-Capillary: 82 mg/dL (ref 70–99)
Glucose-Capillary: 89 mg/dL (ref 70–99)
Glucose-Capillary: 94 mg/dL (ref 70–99)
Glucose-Capillary: 98 mg/dL (ref 70–99)

## 2022-09-11 LAB — CBC
HCT: 30.4 % — ABNORMAL LOW (ref 39.0–52.0)
Hemoglobin: 9.8 g/dL — ABNORMAL LOW (ref 13.0–17.0)
MCH: 30.7 pg (ref 26.0–34.0)
MCHC: 32.2 g/dL (ref 30.0–36.0)
MCV: 95.3 fL (ref 80.0–100.0)
Platelets: 206 10*3/uL (ref 150–400)
RBC: 3.19 MIL/uL — ABNORMAL LOW (ref 4.22–5.81)
RDW: 12.5 % (ref 11.5–15.5)
WBC: 5.1 10*3/uL (ref 4.0–10.5)
nRBC: 0 % (ref 0.0–0.2)

## 2022-09-11 LAB — FOLATE: Folate: 7.6 ng/mL (ref >5.9)

## 2022-09-11 LAB — PHOSPHORUS: Phosphorus: 2.7 mg/dL (ref 2.5–4.6)

## 2022-09-11 LAB — CK: Total CK: 901 U/L — ABNORMAL HIGH (ref 49–397)

## 2022-09-11 LAB — MAGNESIUM: Magnesium: 1.9 mg/dL (ref 1.7–2.4)

## 2022-09-11 MED ORDER — GLUCAGON HCL RDNA (DIAGNOSTIC) 1 MG IJ SOLR
1.0000 mg | INTRAMUSCULAR | Status: AC | PRN
  Administered 2022-09-12: 1 mg via INTRAVENOUS
  Filled 2022-09-11: qty 1

## 2022-09-11 MED ORDER — LORAZEPAM 2 MG/ML IJ SOLN
1.0000 mg | Freq: Once | INTRAMUSCULAR | Status: AC
  Administered 2022-09-11: 1 mg via INTRAVENOUS

## 2022-09-11 MED ORDER — GADOBUTROL 1 MMOL/ML IV SOLN
6.0000 mL | Freq: Once | INTRAVENOUS | Status: AC | PRN
  Administered 2022-09-11: 6 mL via INTRAVENOUS

## 2022-09-11 MED ORDER — LORAZEPAM 2 MG/ML IJ SOLN
INTRAMUSCULAR | Status: AC
  Administered 2022-09-11: 1 mg via INTRAVENOUS
  Filled 2022-09-11: qty 1

## 2022-09-11 MED ORDER — ENSURE ENLIVE PO LIQD
237.0000 mL | Freq: Two times a day (BID) | ORAL | Status: DC
  Administered 2022-09-11 – 2022-09-15 (×11): 237 mL via ORAL

## 2022-09-11 NOTE — Plan of Care (Signed)

## 2022-09-11 NOTE — Hospital Course (Signed)
  Brief Narrative:  27 year old with history of untreated HIV, ADHD, depression, history of syphilis, paranoid schizophrenia comes to the hospital with altered mental status and tremors.  He was admitted to [facility] on 8/13 for same complaints.  He has been noncompliant with his HIV medications for several months now.  He was IVC on 8/4 and hospitalized until 8/9 in behavioral unit in [facility].  On discharge she was given 200 mg of IM Haldol Depo shot.  On the day of admission mother noted that he was very confused lethargic with some staring spell therefore brought him to the hospital.  He was noted to be hypotensive with an rhabdomyolysis.  Admitted to the hospital where he ended up leaving AMA.  Presented again to med [facility] with shaking and similar complaints and rhabdomyolysis and eventually admitted back again to the hospital.     Assessment & Plan:  Principal Problem:   Neuroleptic malignant syndrome     Altered mental status and tremors - Concerns that this could be medication side effect, neuroleptic malignant syndrome.  At this time no obvious evidence of fevers or rigidity.  Seen by psychiatry-medication adjustments per their service -UA -8/13.  UDS positive for THC. - TSH, B12, folate, ammonia are all normal -Will discuss with neurology to see if patient will need MRI brain with contrast, EEG and/or lumbar puncture.  Especially in the setting of untreated HIV   Rhabdomyolysis -Admission CPK 1589, improving with IV fluids   Hypokalemia -Replete   History of HIV Untreated since February 2023.  Will need outpatient follow-up.   Patient attempting to leave AMA, IVC completed overnight.    DVT prophylaxis: enoxaparin (LOVENOX) injection 40 mg Start: 09/09/22 1200 SCDs Start: 09/09/22 1058 Code Status: Full code Family Communication:   On going management for AMS

## 2022-09-11 NOTE — Significant Event (Signed)
Called by floor coverage: Patient attempting to leave AMA.  Clear instructions in chart from psychiatry (see consult note 8/16) to IVC patient if he attempts to leave AMA.  Psychiatry had already filled out IVC forms and left unsigned with Diplomatic Services operational officer.  As per their note "Considering patient left AMA on Monday prior to clearance, will complete IVC paperwork and leave in the front of his chart. Provider will need to sign and date paperwork, if he attempts to leave."  [facility] brought me forms, and I signed them as per their instructions.  AC is notarizing forms, they will be faxed to magistrate.

## 2022-09-11 NOTE — Consult Note (Signed)
[facility] Face-to-Face Psychiatry Consult   Reason for Consult: ''Paranoid Schizophrenia, complaint of possible reaction to Haldol [place].'' Referring Physician:  Stephania Fragmin, MD Patient Identification: Gary Bird MRN:  [id number] Principal Diagnosis: Neuroleptic malignant syndrome Diagnosis:  Principal Problem:   Neuroleptic malignant syndrome Active Problems:   Antipsychotic-induced neurological movement disorder   Total Time spent with patient: 1 hour  Subjective:   Gary Bird is a 27 y.o. male patient admitted with tremors and altered mental status.  HPI: Patient is 27 year old male with history of untreated HIV, ADHD, depression, history of syphilis, paranoid schizophrenia. He was who brought to the hospital for treatment of extrapyramidal symptoms secondary to Haldol deconate injection that was administered days prior to discharge from [facility] where he was hospitalized from August 4 th till 8th.  Patient is a poor historian, collateral information obtained from his mother revealed that he has a long history of Schizophrenia with poor compliance with medications. She states that patient was off his medications since February, 2023 before it was restarted on August 4 th, 2024 at [facility].On the day of admission to [facility], mother reports that patient was confused, disoriented,  lethargic with some staring spell and tremulous. Today patient alert, awake, oriented, calm and cooperative. Patient is more coherent, now speaking clearly with decreased tremors and normal posture.He denies psychosis, anxiety, depression, paranoia, suicidal/homicidal ideations, intent or plan.  Past Psychiatric History: as above  Risk to Self:  denies Risk to Others:  denies Prior Inpatient Therapy:  yes Prior Outpatient Therapy:  [facility]   Past Medical History:  Past Medical History:  Diagnosis Date   ADHD (attention deficit hyperactivity disorder)    Depression     HIV positive (HCC)    Paranoid schizophrenia (HCC)     Past Surgical History:  Procedure Laterality Date   ADENOIDECTOMY W/ MYRINGOTOMY     Family History: History reviewed. No pertinent family history. Family Psychiatric  History:   Social History:  Social History   Substance and Sexual Activity  Alcohol Use Yes   Comment: occ     Social History   Substance and Sexual Activity  Drug Use No    Social History   Socioeconomic History   Marital status: Single    Spouse name: Not on file   Number of children: Not on file   Years of education: Not on file   Highest education level: Not on file  Occupational History   Not on file  Tobacco Use   Smoking status: Every Day    Types: Cigarettes   Smokeless tobacco: Never  Vaping Use   Vaping status: Some Days  Substance and Sexual Activity   Alcohol use: Yes    Comment: occ   Drug use: No   Sexual activity: Not on file  Other Topics Concern   Not on file  Social History Narrative   Not on file   Social Determinants of Health   Financial Resource Strain: Not on file  Food Insecurity: Patient Unable To Answer (09/09/2022)   Hunger Vital Sign    Worried About Running Out of Food in the Last Year: Patient unable to answer    Ran Out of Food in the Last Year: Patient unable to answer  Transportation Needs: Patient Unable To Answer (09/09/2022)   PRAPARE - Transportation    Lack of Transportation (Medical): Patient unable to answer    Lack of Transportation (Non-Medical): Patient unable to answer  Physical Activity: Not on file  Stress: Not on file  Social Connections: Not on file   Additional Social History:    Allergies:   Allergies  Allergen Reactions   Latex Rash    Labs:  Results for orders placed or performed during the hospital encounter of 09/08/22 (from the past 48 hour(s))  Glucose, capillary     Status: Abnormal   Collection Time: 09/09/22  4:55 PM  Result Value Ref Range   Glucose-Capillary 64  (L) 70 - 99 mg/dL    Comment: Glucose reference range applies only to samples taken after fasting for at least 8 hours.   Comment 1 Notify RN    Comment 2 Document in Chart   Glucose, capillary     Status: Abnormal   Collection Time: 09/09/22  5:32 PM  Result Value Ref Range   Glucose-Capillary 114 (H) 70 - 99 mg/dL    Comment: Glucose reference range applies only to samples taken after fasting for at least 8 hours.   Comment 1 Notify RN    Comment 2 Document in Chart   Glucose, capillary     Status: Abnormal   Collection Time: 09/09/22  8:33 PM  Result Value Ref Range   Glucose-Capillary 68 (L) 70 - 99 mg/dL    Comment: Glucose reference range applies only to samples taken after fasting for at least 8 hours.   Comment 1 Notify RN    Comment 2 Document in Chart   Glucose, capillary     Status: Abnormal   Collection Time: 09/09/22  8:52 PM  Result Value Ref Range   Glucose-Capillary 140 (H) 70 - 99 mg/dL    Comment: Glucose reference range applies only to samples taken after fasting for at least 8 hours.  Glucose, capillary     Status: None   Collection Time: 09/09/22 11:34 PM  Result Value Ref Range   Glucose-Capillary 92 70 - 99 mg/dL    Comment: Glucose reference range applies only to samples taken after fasting for at least 8 hours.   Comment 1 Notify RN    Comment 2 Document in Chart   Basic metabolic panel     Status: Abnormal   Collection Time: 09/10/22  2:43 AM  Result Value Ref Range   Sodium 136 135 - 145 mmol/L   Potassium 3.2 (L) 3.5 - 5.1 mmol/L   Chloride 103 98 - 111 mmol/L   CO2 27 22 - 32 mmol/L   Glucose, Bld 86 70 - 99 mg/dL    Comment: Glucose reference range applies only to samples taken after fasting for at least 8 hours.   BUN 6 6 - 20 mg/dL   Creatinine, Ser 4.09 0.61 - 1.24 mg/dL   Calcium 8.1 (L) 8.9 - 10.3 mg/dL   GFR, Estimated >81 >19 mL/min    Comment: (NOTE) Calculated using the CKD-EPI Creatinine Equation (2021)    Anion gap 6 5 - 15     Comment: Performed at [facility], [address], [place], [place] [address]  CBC     Status: Abnormal   Collection Time: 09/10/22  2:43 AM  Result Value Ref Range   WBC 4.9 4.0 - 10.5 K/uL   RBC 2.86 (L) 4.22 - 5.81 MIL/uL   Hemoglobin 8.9 (L) 13.0 - 17.0 g/dL   HCT 95.6 (L) 21.3 - 08.6 %   MCV 95.8 80.0 - 100.0 fL   MCH 31.1 26.0 - 34.0 pg   MCHC 32.5 30.0 -  36.0 g/dL   RDW 16.1 09.6 - 04.5 %   Platelets 180 150 - 400 K/uL   nRBC 0.0 0.0 - 0.2 %    Comment: Performed at [facility], [address], [place], [place] [address]  CK     Status: Abnormal   Collection Time: 09/10/22  2:43 AM  Result Value Ref Range   Total CK 1,064 (H) 49 - 397 U/L    Comment: Performed at [facility], [address] [place], [place] [address]  Glucose, capillary     Status: Abnormal   Collection Time: 09/10/22  3:47 AM  Result Value Ref Range   Glucose-Capillary 69 (L) 70 - 99 mg/dL    Comment: Glucose reference range applies only to samples taken after fasting for at least 8 hours.   Comment 1 Notify RN    Comment 2 Document in Chart   Glucose, capillary     Status: Abnormal   Collection Time: 09/10/22  4:08 AM  Result Value Ref Range   Glucose-Capillary 155 (H) 70 - 99 mg/dL    Comment: Glucose reference range applies only to samples taken after fasting for at least 8 hours.  Glucose, capillary     Status: None   Collection Time: 09/10/22  8:14 AM  Result Value Ref Range   Glucose-Capillary 76 70 - 99 mg/dL    Comment: Glucose reference range applies only to samples taken after fasting for at least 8 hours.  Vitamin B12     Status: None   Collection Time: 09/10/22  8:34 AM  Result Value Ref Range   Vitamin B-12 191 180 - 914 pg/mL    Comment: (NOTE) This assay is not validated for testing neonatal or myeloproliferative syndrome specimens for Vitamin B12 levels. Performed at [facility], [address] [place], [place] [address]   Ammonia     Status: None   Collection Time: 09/10/22  8:34 AM  Result Value Ref Range   Ammonia 18 9 - 35 umol/L    Comment: Performed at [facility], [address], [place], [place] [address]  Glucose, capillary     Status: None   Collection Time: 09/10/22 11:53 AM  Result Value Ref Range   Glucose-Capillary 81 70 - 99 mg/dL    Comment: Glucose reference range applies only to samples taken after fasting for at least 8 hours.  Glucose, capillary     Status: None   Collection Time: 09/10/22  4:08 PM  Result Value Ref Range   Glucose-Capillary 73 70 - 99 mg/dL    Comment: Glucose reference range applies only to samples taken after fasting for at least 8 hours.   Comment 1 Notify RN    Comment 2 Document in Chart   Glucose, capillary     Status: None   Collection Time: 09/10/22  8:21 PM  Result Value Ref Range   Glucose-Capillary 86 70 - 99 mg/dL    Comment: Glucose reference range applies only to samples taken after fasting for at least 8 hours.  Glucose, capillary     Status: None   Collection Time: 09/10/22 11:59 PM  Result Value Ref Range   Glucose-Capillary 82 70 - 99 mg/dL    Comment: Glucose reference range applies only to samples taken after fasting for at least 8 hours.  CK     Status: Abnormal   Collection Time: 09/11/22  2:51 AM  Result Value Ref Range  Total CK 901 (H) 49 - 397 U/L    Comment: Performed at [facility], [address], [place], [place] [address]  Magnesium     Status: None   Collection Time: 09/11/22  2:51 AM  Result Value Ref Range   Magnesium 1.9 1.7 - 2.4 mg/dL    Comment: Performed at [facility], [address], [place], [place] [address]  Basic metabolic panel     Status: Abnormal   Collection Time: 09/11/22  2:51 AM  Result Value Ref Range   Sodium 137 135 - 145 mmol/L   Potassium 3.4 (L) 3.5 - 5.1 mmol/L   Chloride 107 98 - 111 mmol/L   CO2 24 22 -  32 mmol/L   Glucose, Bld 90 70 - 99 mg/dL    Comment: Glucose reference range applies only to samples taken after fasting for at least 8 hours.   BUN <5 (L) 6 - 20 mg/dL   Creatinine, Ser 0.98 0.61 - 1.24 mg/dL   Calcium 8.5 (L) 8.9 - 10.3 mg/dL   GFR, Estimated >11 >91 mL/min    Comment: (NOTE) Calculated using the CKD-EPI Creatinine Equation (2021)    Anion gap 6 5 - 15    Comment: Performed at [facility], [address], [place], [place] [address]  CBC     Status: Abnormal   Collection Time: 09/11/22  2:51 AM  Result Value Ref Range   WBC 5.1 4.0 - 10.5 K/uL   RBC 3.19 (L) 4.22 - 5.81 MIL/uL   Hemoglobin 9.8 (L) 13.0 - 17.0 g/dL   HCT 56.2 (L) 13.0 - 86.5 %   MCV 95.3 80.0 - 100.0 fL   MCH 30.7 26.0 - 34.0 pg   MCHC 32.2 30.0 - 36.0 g/dL   RDW 78.4 69.6 - 29.5 %   Platelets 206 150 - 400 K/uL   nRBC 0.0 0.0 - 0.2 %    Comment: Performed at [facility], [address], [place], [place] [address]  Phosphorus     Status: None   Collection Time: 09/11/22  2:51 AM  Result Value Ref Range   Phosphorus 2.7 2.5 - 4.6 mg/dL    Comment: Performed at [facility], [address], [place], [place] [address]  Folate     Status: None   Collection Time: 09/11/22  2:51 AM  Result Value Ref Range   Folate 7.6 >5.9 ng/mL    Comment: Performed at [facility], [address], [place], [place] [address]  Glucose, capillary     Status: None   Collection Time: 09/11/22  4:01 AM  Result Value Ref Range   Glucose-Capillary 89 70 - 99 mg/dL    Comment: Glucose reference range applies only to samples taken after fasting for at least 8 hours.  Glucose, capillary     Status: None   Collection Time: 09/11/22  8:06 AM  Result Value Ref Range   Glucose-Capillary 74 70 - 99 mg/dL    Comment: Glucose reference range applies only to samples taken after fasting for at least 8 hours.   Comment 1 Notify RN    Comment 2  Document in Chart   Glucose, capillary     Status: None   Collection Time: 09/11/22 11:55 AM  Result Value Ref Range   Glucose-Capillary 94 70 - 99 mg/dL    Comment: Glucose reference range applies only to samples taken after fasting for at least 8  hours.   Comment 1 Notify RN    Comment 2 Document in Chart     Current Facility-Administered Medications  Medication Dose Route Frequency Provider Last Rate Last Admin   0.9 %  sodium chloride infusion   Intravenous Continuous Kirby Crigler, Mir M, MD   Stopped at 09/10/22 1450   acetaminophen (TYLENOL) tablet 650 mg  650 mg Oral Q6H PRN Kirby Crigler, Mir M, MD       Or   acetaminophen (TYLENOL) suppository 650 mg  650 mg Rectal Q6H PRN Kirby Crigler, Mir M, MD       benztropine (COGENTIN) tablet 1 mg  1 mg Oral BID Liam Cammarata, MD   1 mg at 09/11/22 0930   Chlorhexidine Gluconate Cloth 2 % PADS 6 each  6 each Topical Daily Sundil, Subrina, MD   6 each at 09/10/22 1219   dextrose 5 %-0.9 % sodium chloride infusion   Intravenous Continuous Amin, Ankit C, MD 75 mL/hr at 09/11/22 0800 Infusion Verify at 09/11/22 0800   enoxaparin (LOVENOX) injection 40 mg  40 mg Subcutaneous Q24H Kirby Crigler, Mir M, MD   40 mg at 09/11/22 1257   feeding supplement (ENSURE ENLIVE / ENSURE PLUS) liquid 237 mL  237 mL Oral BID BM Amin, Ankit C, MD   237 mL at 09/11/22 1258   glucagon (human recombinant) (GLUCAGEN) injection 1 mg  1 mg Intravenous PRN Amin, Ankit C, MD       guaiFENesin (ROBITUSSIN) 100 MG/5ML liquid 5 mL  5 mL Oral Q4H PRN Amin, Ankit C, MD       hydrALAZINE (APRESOLINE) injection 10 mg  10 mg Intravenous Q4H PRN Amin, Ankit C, MD       ipratropium-albuterol (DUONEB) 0.5-2.5 (3) MG/3ML nebulizer solution 3 mL  3 mL Nebulization Q4H PRN Amin, Ankit C, MD       LORazepam (ATIVAN) tablet 1 mg  1 mg Oral TID Jeda Pardue, MD   1 mg at 09/11/22 0930   metoprolol tartrate (LOPRESSOR) injection 5 mg  5 mg Intravenous Q4H PRN Amin, Ankit C, MD        ondansetron (ZOFRAN) tablet 4 mg  4 mg Oral Q6H PRN Kirby Crigler, Mir M, MD       Or   ondansetron [facility]) injection 4 mg  4 mg Intravenous Q6H PRN Kirby Crigler, Mir M, MD       Oral care mouth rinse  15 mL Mouth Rinse PRN Sundil, Subrina, MD       senna-docusate (Senokot-S) tablet 1 tablet  1 tablet Oral QHS PRN Amin, Ankit C, MD        Musculoskeletal: Strength & Muscle Tone: within normal limits Gait & Station: normal Patient leans: N/A    Psychiatric Specialty Exam:  Presentation  General Appearance:  Appropriate for Environment  Eye Contact: Good  Speech: Clear and Coherent  Speech Volume: Decreased  Handedness: Right   Mood and Affect  Mood: Dysphoric  Affect: Congruent   Thought Process  Thought Processes: Linear; Goal Directed  Descriptions of Associations:Intact  Orientation:Full (Time, Place and Person)  Thought Content:Logical  History of Schizophrenia/Schizoaffective disorder:No data recorded Duration of Psychotic Symptoms:No data recorded Hallucinations:Hallucinations: None  Ideas of Reference:None  Suicidal Thoughts:Suicidal Thoughts: No  Homicidal Thoughts:Homicidal Thoughts: No   Sensorium  Memory: Immediate Good; Recent Fair; Remote Fair  Judgment: Intact  Insight: Present   Executive Functions  Concentration: Fair  Attention Span: Good  Recall: Fair  Fund of Knowledge: Fair  Language: Good   Psychomotor  Activity  Psychomotor Activity: Psychomotor Activity: Decreased   Assets  Assets: Communication Skills; Desire for Improvement   Sleep  Sleep: Sleep: Fair   Physical Exam: Physical Exam ROS Blood pressure (!) 104/56, pulse 71, temperature 98.1 F (36.7 C), temperature source Oral, resp. rate 15, height 5\' 5"  (1.651 m), weight 59.6 kg, SpO2 99%. Body mass index is 21.87 kg/m.  Treatment Plan Summary:  Provisional diagnosis: Antipsychotic-induced neurological movement disorder    Plan/recommendations: -Consider referral to speech therapy for speech evaluation due to poor verbal output -Consider physical/occupational therapy  -Continue Lorazepam 1 mg TID for dystonia/tremors x 24 hours and re-evaluate thereafter -Continue  Benztropine 1 mg BID for extrapyramidal effects -D/C Haldol decanoate -Monitor CK level daily - Strongly encourage ongoing monitoring and searching for additional organic causes other than medications.  -EKG obtained on 08/16, QT 458.Patient remains at risk for QTc prolongation which warrant EKG monitoring    Disposition: - Psychiatry will continue to follow at this time.   Thedore Mins, MD 09/11/2022 1:16 PM

## 2022-09-11 NOTE — Consult Note (Signed)
Neurology Consultation  Reason for Consult: Altered mental status and tremors Referring Physician: Dr. Nelson Chimes  CC: Tremors of arms and legs  History is obtained from: Patient and chart  HPI: Gary Bird is a 27 y.o. male with history of HIV untreated since last year, ADHD, depression, syphilis and paranoid schizophrenia who presented with altered mental status and tremors.  He was seen on 8/13 for the same complaints but left AMA.  He has not taken his HIV medications since last year but states he is willing to restart.  He denies recent illness and said that he had a transient frontal and bitemporal sharp headache 2 days ago but that it was brief and resolved on its own.  He denies neck pain or stiffness.  On 8/9 at discharge from the behavioral health hospital, he was given a Haldol Depo shot.  It is possible that his altered mental status and tremors are a side effect of the Haldol, but given HIV status will need workup for CNS infection.   ROS: A complete ROS was performed and is negative except as noted in the HPI.   Past Medical History:  Diagnosis Date   ADHD (attention deficit hyperactivity disorder)    Depression    HIV positive (HCC)    Paranoid schizophrenia (HCC)      History reviewed. No pertinent family history.   Social History:   reports that he has been smoking cigarettes. He has never used smokeless tobacco. He reports current alcohol use. He reports that he does not use drugs.  Medications  Current Facility-Administered Medications:    0.9 %  sodium chloride infusion, , Intravenous, Continuous, Kirby Crigler, Mir M, MD, Stopped at 09/10/22 1450   acetaminophen (TYLENOL) tablet 650 mg, 650 mg, Oral, Q6H PRN **OR** acetaminophen (TYLENOL) suppository 650 mg, 650 mg, Rectal, Q6H PRN, Kirby Crigler, Mir M, MD   benztropine (COGENTIN) tablet 1 mg, 1 mg, Oral, BID, Akintayo, Mojeed, MD, 1 mg at 09/11/22 0930   Chlorhexidine Gluconate Cloth 2 % PADS 6 each, 6 each, Topical,  Daily, Sundil, Subrina, MD, 6 each at 09/10/22 1219   dextrose 5 %-0.9 % sodium chloride infusion, , Intravenous, Continuous, Amin, Ankit C, MD, Last Rate: 75 mL/hr at 09/11/22 0800, Infusion Verify at 09/11/22 0800   enoxaparin (LOVENOX) injection 40 mg, 40 mg, Subcutaneous, Q24H, Kirby Crigler, Mir M, MD, 40 mg at 09/11/22 1257   feeding supplement (ENSURE ENLIVE / ENSURE PLUS) liquid 237 mL, 237 mL, Oral, BID BM, Amin, Ankit C, MD, 237 mL at 09/11/22 1258   glucagon (human recombinant) (GLUCAGEN) injection 1 mg, 1 mg, Intravenous, PRN, Amin, Ankit C, MD   guaiFENesin (ROBITUSSIN) 100 MG/5ML liquid 5 mL, 5 mL, Oral, Q4H PRN, Amin, Ankit C, MD   hydrALAZINE (APRESOLINE) injection 10 mg, 10 mg, Intravenous, Q4H PRN, Amin, Ankit C, MD   ipratropium-albuterol (DUONEB) 0.5-2.5 (3) MG/3ML nebulizer solution 3 mL, 3 mL, Nebulization, Q4H PRN, Amin, Ankit C, MD   LORazepam (ATIVAN) tablet 1 mg, 1 mg, Oral, TID, Akintayo, Mojeed, MD, 1 mg at 09/11/22 0930   metoprolol tartrate (LOPRESSOR) injection 5 mg, 5 mg, Intravenous, Q4H PRN, Amin, Ankit C, MD   ondansetron (ZOFRAN) tablet 4 mg, 4 mg, Oral, Q6H PRN **OR** ondansetron (ZOFRAN) injection 4 mg, 4 mg, Intravenous, Q6H PRN, Kirby Crigler, Mir M, MD   Oral care mouth rinse, 15 mL, Mouth Rinse, PRN, Sundil, Subrina, MD   senna-docusate (Senokot-S) tablet 1 tablet, 1 tablet, Oral, QHS PRN, Nelson Chimes, Ankit C, MD  Exam: Current vital signs: BP (!) 104/56 (BP Location: Left Wrist) Comment: RN notified  Pulse 71   Temp 98.1 F (36.7 C) (Oral)   Resp 15   Ht 5\' 5"  (1.651 m)   Wt 59.6 kg   SpO2 99%   BMI 21.87 kg/m  Vital signs in last 24 hours: Temp:  [97.6 F (36.4 C)-98.3 F (36.8 C)] 98.1 F (36.7 C) (08/18 1200) Pulse Rate:  [48-110] 71 (08/18 1200) Resp:  [13-27] 15 (08/18 1000) BP: (93-142)/(52-104) 104/56 (08/18 1200) SpO2:  [99 %-100 %] 99 % (08/18 0800)  GENERAL: Awake, alert, in no acute distress Psych: Affect appropriate for situation,  patient is calm and cooperative with examination Head: Normocephalic and atraumatic, without obvious abnormality EENT: Normal conjunctivae, moist mucous membranes, no OP obstruction LUNGS: Normal respiratory effort. Non-labored breathing on room air CV: Regular rate and rhythm on telemetry Extremities: warm, well perfused, without obvious deformity  NEURO:  Mental Status: Awake, alert, and oriented to person, place, time, and situation.  Patient's speech is often tangential in nature, and statements are at times nonsensical. He is able to provide some history of present illness. Speech/Language: speech is clear and fluent.   No neglect is noted Cranial Nerves:  II: PERRL III, IV, VI: EOMI. Lid elevation symmetric and full.  V: Sensation is intact to light touch and symmetrical to face.  VII: Face is symmetric resting and smiling. Mask like faces. VIII: Hearing intact to voice IX, X: Voice is hypophonic XI: Normal sternocleidomastoid and trapezius muscle strength XII: Tongue protrudes midline without fasciculations.   Motor: 5/5 strength is all muscle groups.  Tone with mild cogwheel rigidity. Bulk is normal.  Sensation: Intact to light touch bilaterally in all four extremities.  Coordination: FTN intact bilaterally. Gait: Deferred Negative Kernig's and Brudzinski sign  Labs I have reviewed labs in epic and the results pertinent to this consultation are:   CBC    Component Value Date/Time   WBC 5.1 09/11/2022 0251   RBC 3.19 (L) 09/11/2022 0251   HGB 9.8 (L) 09/11/2022 0251   HCT 30.4 (L) 09/11/2022 0251   PLT 206 09/11/2022 0251   MCV 95.3 09/11/2022 0251   MCH 30.7 09/11/2022 0251   MCHC 32.2 09/11/2022 0251   RDW 12.5 09/11/2022 0251   LYMPHSABS 2.3 09/08/2022 2300   MONOABS 0.6 09/08/2022 2300   EOSABS 0.1 09/08/2022 2300   BASOSABS 0.0 09/08/2022 2300    CMP     Component Value Date/Time   NA 137 09/11/2022 0251   K 3.4 (L) 09/11/2022 0251   CL 107  09/11/2022 0251   CO2 24 09/11/2022 0251   GLUCOSE 90 09/11/2022 0251   BUN <5 (L) 09/11/2022 0251   CREATININE 0.92 09/11/2022 0251   CALCIUM 8.5 (L) 09/11/2022 0251   PROT 8.1 09/08/2022 2300   ALBUMIN 4.1 09/08/2022 2300   AST 53 (H) 09/08/2022 2300   ALT 23 09/08/2022 2300   ALKPHOS 33 (L) 09/08/2022 2300   BILITOT 0.3 09/08/2022 2300   GFRNONAA >60 09/11/2022 0251   GFRAA >60 07/19/2018 0815    Lipid Panel  No results found for: "CHOL", "TRIG", "HDL", "CHOLHDL", "VLDL", "LDLCALC", "LDLDIRECT"   Imaging I have reviewed the images obtained:  CT-scan of the brain 8/13: No acute abnormality  MRI examination of the brain: Pending  EEG: Pending  Assessment: 27 year old patient with history of HIV untreated since last year, ADHD, depression, syphilis and paranoid schizophrenia presents with altered mental status and tremors.  Of note, on 8/9, he was given a Haldol Depo shot, and EPS from the Haldol may be the cause of his tremors.  Tremors appear to have improved with benzatropine.  However, given untreated HIV will need to evaluate for CNS infection.  Patient denies any neck pain or stiffness and had a transient headache 2 days ago.  Would involve ID, as patient states willingness to go back on HIV therapy.  On exam, patient shows no abnormalities other than tangential and at times nonsensical statements.  Unsure what baseline mental status is.  Will need to evaluate patient with EEG, MRI with and without contrast as well as lumbar puncture to investigate for possible CNS infection.  CK was noted to be elevated on admission but is trending down with IV fluids.  There was concern for NMS on patient's last admission, but he does not appear to be hyperthermic.  Impression: Tremors in patient with schizophrenia and untreated HIV, with differential to include EPS from Haldol or CNS infection  Recommendations: -MRI brain with and without contrast. -Routine EEG -Lumbar puncture with  meningitis/encephalitis panel, fungal culture and cryptococcal antigen -CD4, RPR given history of syphilis and HIV -Would recommend ID consult  Pt seen by NP/Neuro and later by MD. Note/plan to be edited by MD as needed.   E Ernestina Columbia , MSN, AGACNP-BC [facility] See Amion for schedule and pager information 09/11/2022 1:22 PM   NEUROHOSPITALIST ADDENDUM Performed a face to face diagnostic evaluation.   I have reviewed the contents of history and physical exam as documented by PA/ARNP/Resident and agree with above documentation.  I have discussed and formulated the above plan as documented. Edits to the note have been made as needed.  Impression/Key exam findings/Plan: his underlying schizophrenia makes it difficult to evaluate for confusion. He clearly has disorganized thoughts, poor insight, he is also mildly bradykinetic with mask lie faces and mild cogwheeling and slightly hyperreflexic on exam. While I dont think he is floridly NMS, he does have some parkinsonian features which could be from depot haldol.  I still suspect that he has confusion/encephalopathy out of proportion to disorganized thought process typical of schizophrenia. The fact that he has been getting ativan could be causing confusion but with his hx of HIV, LP would be important to rule out underlying CNS infection. Not uncommon for patient with HIV and non compliance with HAART to have subtle if any signs of meningitis/encephalitis and this makes it difficult to clinically rulre out CNS infection.  I do think LP would help rule out a CNS infection. Also mentioned to my NP that he is interested in resuming HAART. Defer that to primary team and ID.  Follow up rEEG.   Erick Blinks, MD [facility] [phone]   If 7pm to 7am, please call on call as listed on AMION.

## 2022-09-11 NOTE — Progress Notes (Signed)
PROGRESS NOTE    Gary Bird  BMW:[id number] DOB: [birth year] DOA: 09/08/2022 PCP: Patient, No Pcp Per     Brief Narrative:  27 year old with history of untreated HIV, ADHD, depression, history of syphilis, paranoid schizophrenia comes to the hospital with altered mental status and tremors.  He was admitted to [facility] on 8/13 for same complaints.  He has been noncompliant with his HIV medications for several months now.  He was IVC on 8/4 and hospitalized until 8/9 in behavioral unit in [facility].  On discharge she was given 200 mg of IM Haldol Depo shot.  On the day of admission mother noted that he was very confused lethargic with some staring spell therefore brought him to the hospital.  He was noted to be hypotensive with an rhabdomyolysis.  Admitted to the hospital where he ended up leaving AMA.  Presented again to med [facility] with shaking and similar complaints and rhabdomyolysis and eventually admitted back again to the hospital.     Assessment & Plan:  Principal Problem:   Neuroleptic malignant syndrome     Altered mental status and tremors - Concerns that this could be medication side effect, neuroleptic malignant syndrome.  At this time no obvious evidence of fevers or rigidity.  Seen by psychiatry-medication adjustments per their service -UA -8/13.  UDS positive for THC. - TSH, B12, folate, ammonia are all normal -Will discuss with neurology to see if patient will need MRI brain with contrast, EEG and/or lumbar puncture.  Especially in the setting of untreated HIV   Rhabdomyolysis -Admission CPK 1589, improving with IV fluids   Hypokalemia -Replete   History of HIV Untreated since February 2023.  Will need outpatient follow-up.   Patient attempting to leave AMA, IVC completed overnight.    DVT prophylaxis: enoxaparin (LOVENOX) injection 40 mg Start: 09/09/22 1200 SCDs Start: 09/09/22 1058 Code Status: Full code Family Communication:   On going  management for AMS               Diet Orders (From admission, onward)     Start     Ordered   09/09/22 1057  Diet regular Fluid consistency: Thin  Diet effective now       Question:  Fluid consistency:  Answer:  Thin   09/09/22 1056            Subjective: Ate his breakfast this morning, doing little bit better this morning.  Less tremors and more awake   Examination:  General exam: Appears calm and comfortable  Respiratory system: Clear to auscultation. Respiratory effort normal. Cardiovascular system: S1 & S2 heard, RRR. No JVD, murmurs, rubs, gallops or clicks. No pedal edema. Gastrointestinal system: Abdomen is nondistended, soft and nontender. No organomegaly or masses felt. Normal bowel sounds heard. Central nervous system: Alert and oriented. No focal neurological deficits. Extremities: Symmetric 5 x 5 power. Skin: No rashes, lesions or ulcers Psychiatry: Judgement and insight appear poor  Objective: Vitals:   09/11/22 0400 09/11/22 0610 09/11/22 0800 09/11/22 1000  BP:  103/65 (!) 93/59 (!) 115/57  Pulse:  (!) 48 (!) 57   Resp:  13 13 15   Temp: 97.9 F (36.6 C)  97.6 F (36.4 C)   TempSrc: Axillary  Oral   SpO2:  100% 99%   Weight:      Height:        Intake/Output Summary (Last 24 hours) at 09/11/2022 1132 Last data filed at 09/11/2022 0945 Gross per 24 hour  Intake 2499.32 ml  Output 1600 ml  Net 899.32 ml   Filed Weights   09/09/22 1557  Weight: 59.6 kg    Scheduled Meds:  benztropine  1 mg Oral BID   Chlorhexidine Gluconate Cloth  6 each Topical Daily   enoxaparin (LOVENOX) injection  40 mg Subcutaneous Q24H   feeding supplement  237 mL Oral BID BM   LORazepam  1 mg Oral TID   Continuous Infusions:  sodium chloride Stopped (09/10/22 1450)   dextrose 5 % and 0.9 % NaCl 75 mL/hr at 09/11/22 0800    Nutritional status     Body mass index is 21.87 kg/m.  Data Reviewed:   CBC: Recent Labs  Lab 09/06/22 0108 09/06/22 1347  09/08/22 2300 09/10/22 0243 09/11/22 0251  WBC 5.7 4.5 6.5 4.9 5.1  NEUTROABS  --   --  3.4  --   --   HGB 11.5* 9.5* 10.2* 8.9* 9.8*  HCT 34.5* 28.5* 30.5* 27.4* 30.4*  MCV 90.3 91.1 91.3 95.8 95.3  PLT 217 168 207 180 206   Basic Metabolic Panel: Recent Labs  Lab 09/06/22 0108 09/06/22 1347 09/08/22 2300 09/10/22 0243 09/11/22 0251  NA 138 136 136 136 137  K 3.4* 3.5 3.1* 3.2* 3.4*  CL 100 104 97* 103 107  CO2 24 25 28 27 24   GLUCOSE 106* 84 92 86 90  BUN 10 8 6 6  <5*  CREATININE 1.14 0.97 0.91 0.94 0.92  CALCIUM 10.0 8.4* 9.1 8.1* 8.5*  MG  --  2.0  --   --  1.9  PHOS  --  3.4  --   --  2.7   GFR: Estimated Creatinine Clearance: 101.7 mL/min (by C-G formula based on SCr of 0.92 mg/dL). Liver Function Tests: Recent Labs  Lab 09/06/22 0108 09/06/22 1347 09/08/22 2300  AST 39 55* 53*  ALT 16 20 23   ALKPHOS 29* 23* 33*  BILITOT 0.2* 0.6 0.3  PROT 8.6* 6.5 8.1  ALBUMIN 4.2 3.1* 4.1   No results for input(s): "LIPASE", "AMYLASE" in the last 168 hours. Recent Labs  Lab 09/06/22 1347 09/10/22 0834  AMMONIA 23 18   Coagulation Profile: No results for input(s): "INR", "PROTIME" in the last 168 hours. Cardiac Enzymes: Recent Labs  Lab 09/06/22 0108 09/06/22 1347 09/08/22 2300 09/10/22 0243 09/11/22 0251  CKTOTAL 1,232* 2,384* 1,589* 1,064* 901*   BNP (last 3 results) No results for input(s): "PROBNP" in the last 8760 hours. HbA1C: No results for input(s): "HGBA1C" in the last 72 hours. CBG: Recent Labs  Lab 09/10/22 1608 09/10/22 2021 09/10/22 2359 09/11/22 0401 09/11/22 0806  GLUCAP 73 86 82 89 74   Lipid Profile: No results for input(s): "CHOL", "HDL", "LDLCALC", "TRIG", "CHOLHDL", "LDLDIRECT" in the last 72 hours. Thyroid Function Tests: No results for input(s): "TSH", "T4TOTAL", "FREET4", "T3FREE", "THYROIDAB" in the last 72 hours. Anemia Panel: Recent Labs    09/10/22 0834 09/11/22 0251  VITAMINB12 191  --   FOLATE  --  7.6    Sepsis Labs: Recent Labs  Lab 09/06/22 1632 09/06/22 1748  LATICACIDVEN 1.0 1.1    Recent Results (from the past 240 hour(s))  MRSA Next Gen by PCR, Nasal     Status: None   Collection Time: 09/09/22 11:17 AM   Specimen: Nasal Mucosa; Nasal Swab  Result Value Ref Range Status   MRSA by PCR Next Gen NOT DETECTED NOT DETECTED Final    Comment: (NOTE) The GeneXpert MRSA Assay (FDA approved for NASAL  specimens only), is one component of a comprehensive MRSA colonization surveillance program. It is not intended to diagnose MRSA infection nor to guide or monitor treatment for MRSA infections. Test performance is not FDA approved in patients less than 2 years old. Performed at [facility], [address] [place], [place] [address]          Radiology Studies: No results found.         LOS: 2 days   Time spent= 35 mins    Miguel Rota, MD [facility]  If 7PM-7AM, please contact night-coverage  09/11/2022, 11:32 AM

## 2022-09-11 NOTE — Plan of Care (Signed)

## 2022-09-11 NOTE — Progress Notes (Signed)
       Overnight   NAME: Gary Bird MRN: [id number] DOB : [birth year]    Date of Service   09/11/2022   HPI/Events of Note    Notified by RN for aggression and attempting to leave.  27 year old male brought to ER for treatment of extraparametal symptoms secondary to Haldol decanoate injection administered days prior to discharge from [facility].  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  IVC order had previously been relayed and signed as patient attempted to leave.   Interventions/ Plan   Waist belt Additional PRN medication as advised prior IVC order completed -pending      Chinita Greenland BSN MSNA MSN ACNPC-AG Acute Care Nurse Practitioner [facility]

## 2022-09-12 ENCOUNTER — Inpatient Hospital Stay (HOSPITAL_COMMUNITY)
Admit: 2022-09-12 | Discharge: 2022-09-12 | Disposition: A | Discharge status: Home / Self Care | Payer: MEDICAID | Attending: Internal Medicine | Admitting: Internal Medicine

## 2022-09-12 ENCOUNTER — Other Ambulatory Visit (HOSPITAL_COMMUNITY): Payer: Self-pay

## 2022-09-12 DIAGNOSIS — R569 Unspecified convulsions: Secondary | ICD-10-CM

## 2022-09-12 DIAGNOSIS — R4182 Altered mental status, unspecified: Secondary | ICD-10-CM

## 2022-09-12 DIAGNOSIS — G210 Malignant neuroleptic syndrome: Secondary | ICD-10-CM | POA: Diagnosis not present

## 2022-09-12 LAB — BASIC METABOLIC PANEL
Anion gap: 6 (ref 5–15)
BUN: <5 mg/dL — ABNORMAL LOW (ref 6–20)
CO2: 28 mmol/L (ref 22–32)
Calcium: 8.6 mg/dL — ABNORMAL LOW (ref 8.9–10.3)
Chloride: 103 mmol/L (ref 98–111)
Creatinine, Ser: 0.99 mg/dL (ref 0.61–1.24)
GFR, Estimated: >60 mL/min (ref >60)
Glucose, Bld: 96 mg/dL (ref 70–99)
Potassium: 3.3 mmol/L — ABNORMAL LOW (ref 3.5–5.1)
Sodium: 137 mmol/L (ref 135–145)

## 2022-09-12 LAB — CD4/CD8 (T-HELPER/T-SUPPRESSOR CELL)
CD4 absolute: 419 /uL (ref 400–1790)
CD4%: 26.29 % — ABNORMAL LOW (ref 33–65)
CD8 T Cell Abs: 830 /uL (ref 190–1000)
CD8tox: 52.01 % — ABNORMAL HIGH (ref 12–40)
Ratio: 0.51 — ABNORMAL LOW (ref 1.0–3.0)
Total lymphocyte count: 1596 /uL (ref 1000–4000)

## 2022-09-12 LAB — CBC
HCT: 28.8 % — ABNORMAL LOW (ref 39.0–52.0)
Hemoglobin: 9.4 g/dL — ABNORMAL LOW (ref 13.0–17.0)
MCH: 30.5 pg (ref 26.0–34.0)
MCHC: 32.6 g/dL (ref 30.0–36.0)
MCV: 93.5 fL (ref 80.0–100.0)
Platelets: 214 10*3/uL (ref 150–400)
RBC: 3.08 MIL/uL — ABNORMAL LOW (ref 4.22–5.81)
RDW: 12.5 % (ref 11.5–15.5)
WBC: 3.6 10*3/uL — ABNORMAL LOW (ref 4.0–10.5)
nRBC: 0 % (ref 0.0–0.2)

## 2022-09-12 LAB — HEPATITIS PANEL, ACUTE
HCV Ab: NONREACTIVE
Hep A IgM: NONREACTIVE
Hep B C IgM: NONREACTIVE
Hepatitis B Surface Ag: NONREACTIVE

## 2022-09-12 LAB — RPR
RPR Ser Ql: REACTIVE — AB
RPR Titer: 1:1 {titer}

## 2022-09-12 LAB — GLUCOSE, CAPILLARY
Glucose-Capillary: 102 mg/dL — ABNORMAL HIGH (ref 70–99)
Glucose-Capillary: 116 mg/dL — ABNORMAL HIGH (ref 70–99)
Glucose-Capillary: 69 mg/dL — ABNORMAL LOW (ref 70–99)
Glucose-Capillary: 79 mg/dL (ref 70–99)
Glucose-Capillary: 84 mg/dL (ref 70–99)
Glucose-Capillary: 85 mg/dL (ref 70–99)
Glucose-Capillary: 85 mg/dL (ref 70–99)
Glucose-Capillary: 88 mg/dL (ref 70–99)

## 2022-09-12 LAB — CRYPTOCOCCAL ANTIGEN: Crypto Ag: NEGATIVE

## 2022-09-12 LAB — CK: Total CK: 593 U/L — ABNORMAL HIGH (ref 49–397)

## 2022-09-12 LAB — MAGNESIUM: Magnesium: 1.9 mg/dL (ref 1.7–2.4)

## 2022-09-12 MED ORDER — BICTEGRAVIR-EMTRICITAB-TENOFOV 50-200-25 MG PO TABS
1.0000 | ORAL_TABLET | Freq: Every day | ORAL | Status: DC
  Administered 2022-09-12 – 2022-09-15 (×4): 1 via ORAL
  Filled 2022-09-12 (×4): qty 1

## 2022-09-12 MED ORDER — POTASSIUM CHLORIDE CRYS ER 20 MEQ PO TBCR
30.0000 meq | EXTENDED_RELEASE_TABLET | Freq: Once | ORAL | Status: AC
  Administered 2022-09-12: 30 meq via ORAL
  Filled 2022-09-12: qty 1

## 2022-09-12 NOTE — Plan of Care (Signed)

## 2022-09-12 NOTE — Progress Notes (Signed)
EEG complete - results pending 

## 2022-09-12 NOTE — Procedures (Signed)
Patient Name: Gary Bird  MRN: [id number]  Epilepsy Attending: Charlsie Quest  Referring Physician/Provider: Marjorie Smolder, NP  Date: 09/12/2022 Duration: 24.37 mins  Patient history:  27 y.o. male with history of HIV untreated since last year, ADHD, depression, syphilis and paranoid schizophrenia who presented with altered mental status and tremors. EEG to evaluate for seizure.  Level of alertness: Awake, drowsy  AEDs during EEG study: Ativan  Technical aspects: This EEG study was done with scalp electrodes positioned according to the 10-20 International system of electrode placement. Electrical activity was reviewed with band pass filter of 1-70Hz , sensitivity of 7 uV/mm, display speed of 34mm/sec with a 60Hz  notched filter applied as appropriate. EEG data were recorded continuously and digitally stored.  Video monitoring was available and reviewed as appropriate.  Description: The posterior dominant rhythm consists of 9 Hz activity of moderate voltage (25-35 uV) seen predominantly in posterior head regions, symmetric and reactive to eye opening and eye closing. Drowsiness was characterized by attenuation of the posterior background rhythm. Hyperventilation and photic stimulation were not performed.     IMPRESSION: This study is within normal limits. No seizures or epileptiform discharges were seen throughout the recording.  A normal interictal EEG does not exclude the diagnosis of epilepsy.  Kris No Annabelle Harman

## 2022-09-12 NOTE — Consult Note (Signed)
[facility]    Date of Admission:  09/08/2022     Reason for Consult: ams, hiv    Referring Provider: Nelson Chimes     Lines:  Peripheral iv's  Abx: Outpatient biktarvy resumed        Assessment: 27 yo male hiv noncompliant with art, hx syphilis (rpr 128 in 2019), paranoid schizophrenia, recent inpatient 8/4-8/09/24 [facility] given I'm haldo depot and started on maintenance haldol, admitted 8/15 for ams, tremor, diaphoretic spell with elevated cpk 1200-1500, without fever, along with intermittent soft systolic blood pressure. Id called to evaluate for possible infectious disease related cause   Brain mri essentially normal 8/13 uds with benzo 8/15 uds with marijuanna   Story is consistent with nms. Although currently lacking fever, he have labile blood pressure and elevated cpk along with tremor/rigidity. Suspect he is coming to a resolution   Ddx include infectious encephalitis, seizure, illicit drug related toxicity/withdrawal. Certainly outside of pml, hiv process itself can have a chronic extrapyramidal adverse effect on the cns system as well (HIV associated neurocognitive degeneration) and can be difficult to diagnose   His chemistries and labs look normal so far  Cpk trending down with supportive care  He had continued to show no sign of sepsis here   Brain mri normal, suggestive against process such as pml, cns lymphoma/toxo or tb/endemic fungi.  He had hx syphilis but this would be atypical for tertiary process. Will repeat rpr and f/u on prior treatment. This can be followed up in his id clinic    Will send hepatitis for other health prevention purpose      From id standpoint, I do not feel strongly about sending any cns labs for infectious workup in terms of exotic process as likely false positive      Plan: I have added repeat rpr titer, serum crypto, hiv viral load and cd4 for now  Hiv  integrase strand inhibitor, nrti/nnrti/pi resistance testing ordered Dont' feel strongly about further ID w/u at this time given his clinical history/evolution Resume biktarvy He stated he'll follow up with [facility] for id care and appointment is end of this month We'll have 1 month biktarvy on hand for him Discussed with primary team     ------------------------------------------------ Principal Problem:   Neuroleptic malignant syndrome Active Problems:   Antipsychotic-induced neurological movement disorder    HPI: Gary Bird is a 27 y.o. male hiv noncompliant with art, hx syphilis (rpr 128 in 2019), paranoid schizophrenia, recent inpatient 8/4-8/09/24 [facility] given IM haldo depot and started on maintenance haldol, admitted 8/15 for ams, tremor, diaphoretic spell with elevated cpk 1200-1500, without fever, along with intermittent soft systolic blood pressure. Id called to evaluate for possible infectious disease related cause  Hiv-- Patient on genvoya then at some point had ?cabenuva, and per his mother report getting biktarvy but he said he hasn't taken anything at least the past 6 months He sees [facility]; last seen 02/2021 Cd4 early 08/2022 was mid 200s (20%)  His hiv viral load was undetectable 01/2021. He was dx'ed in 2016   Syphilis-- Titer 128 in 2019; unclear treatment but serial titer at [facility] trending down and in 08/28/22 nonreactive    He has recent admission as above for psychosis and given IM depot haldol then haldol with subsequent ams, ridigidity/tremor, and admitted to [facility] 8/13 left ama back on 8/15.  Noted labile blood pressure, elevated cpk, but no fever. Uds thc and benzo. Labs without chemistry abnormalities or leukocytosis. Course without sepsis   His cpk is trending down  Today he said he feels well, enjoying his food, and denies headache, visual change, hearing change, muscle pain, muscle waekness,  numbness, tingling, rash, penile discharge, n/v/diarrhea, cough, dyspnea, chest pain, or other focal pain  He said his tremor had also resolved  He has id f/u 8/30 and plans to [facility]   I spoke with his mother on speaker phone and she wants to make sure he doesn't go back to how he was leading to admission. She does seem to agree patient appears well   Brain mri negative Chest xray normal   History reviewed. No pertinent family history.  Social History   Tobacco Use   Smoking status: Every Day    Types: Cigarettes   Smokeless tobacco: Never  Vaping Use   Vaping status: Some Days  Substance Use Topics   Alcohol use: Yes    Comment: occ   Drug use: No    Allergies  Allergen Reactions   Latex Rash    Review of Systems: ROS All Other ROS was negative, except mentioned above   Past Medical History:  Diagnosis Date   ADHD (attention deficit hyperactivity disorder)    Depression    HIV positive (HCC)    Paranoid schizophrenia (HCC)        Scheduled Meds:  benztropine  1 mg Oral BID   Chlorhexidine Gluconate Cloth  6 each Topical Daily   enoxaparin (LOVENOX) injection  40 mg Subcutaneous Q24H   feeding supplement  237 mL Oral BID BM   LORazepam  1 mg Oral TID   Continuous Infusions:  sodium chloride Stopped (09/10/22 1450)   dextrose 5 % and 0.9 % NaCl 75 mL/hr at 09/12/22 0600   PRN Meds:.acetaminophen **OR** acetaminophen, glucagon (human recombinant), guaiFENesin, hydrALAZINE, ipratropium-albuterol, metoprolol tartrate, ondansetron **OR** ondansetron (ZOFRAN) IV, mouth rinse, senna-docusate   OBJECTIVE: Blood pressure 101/66, pulse (!) 51, temperature 97.8 F (36.6 C), temperature source Oral, resp. rate 15, height 5\' 5"  (1.651 m), weight 59.6 kg, SpO2 98%.  Physical Exam  General/constitutional: no distress, pleasant HEENT: Normocephalic, PER, Conj Clear, EOMI, Oropharynx clear Neck supple CV: rrr no mrg Lungs: clear to  auscultation, normal respiratory effort Abd: Soft, Nontender Ext: no edema Skin: No Rash outside of seborrheic dermatitis on face; and a left upper back 3 mm pigmented mole Neuro: nonfocal MSK: no peripheral joint swelling/tenderness/warmth; back spines nontender   Lab Results Lab Results  Component Value Date   WBC 3.6 (L) 09/12/2022   HGB 9.4 (L) 09/12/2022   HCT 28.8 (L) 09/12/2022   MCV 93.5 09/12/2022   PLT 214 09/12/2022    Lab Results  Component Value Date   CREATININE 0.99 09/12/2022   BUN <5 (L) 09/12/2022   NA 137 09/12/2022   K 3.3 (L) 09/12/2022   CL 103 09/12/2022   CO2 28 09/12/2022    Lab Results  Component Value Date   ALT 23 09/08/2022   AST 53 (H) 09/08/2022   ALKPHOS 33 (L) 09/08/2022   BILITOT 0.3 09/08/2022      Microbiology: Recent Results (from the past 240 hour(s))  MRSA Next Gen by PCR, Nasal     Status: None   Collection Time: 09/09/22 11:17 AM   Specimen: Nasal Mucosa; Nasal Swab  Result Value Ref Range Status   MRSA by  PCR Next Gen NOT DETECTED NOT DETECTED Final    Comment: (NOTE) The GeneXpert MRSA Assay (FDA approved for NASAL specimens only), is one component of a comprehensive MRSA colonization surveillance program. It is not intended to diagnose MRSA infection nor to guide or monitor treatment for MRSA infections. Test performance is not FDA approved in patients less than 86 years old. Performed at [facility], [address], [place], [place] [address]      Serology:    Imaging: If present, new imagings (plain films, ct scans, and mri) have been personally visualized and interpreted; radiology reports have been reviewed. Decision making incorporated into the Impression / Recommendations.  8/17 mri brain Normal brain MRI.   8/13 cxr Scoliosis otherwise no acute cardiopulm pathology  Normal brain MRI.  Raymondo Band, MD [facility] [phone]  pager    09/12/2022, 10:12 AM

## 2022-09-12 NOTE — Progress Notes (Signed)
PROGRESS NOTE    Gary Bird  WUJ:[id number] DOB: 08/10/1995 DOA: 09/08/2022 PCP: Patient, No Pcp Per     Brief Narrative:  27 year old with history of untreated HIV, ADHD, depression, history of syphilis, paranoid schizophrenia comes to the hospital with altered mental status and tremors.  He was admitted to [facility] on 8/13 for same complaints.  He has been noncompliant with his HIV medications for several months now.  He was IVC on 8/4 and hospitalized until 8/9 in behavioral unit in [facility].  On discharge she was given 200 mg of IM Haldol Depo shot.  On the day of admission mother noted that he was very confused lethargic with some staring spell therefore brought him to the hospital.  He was noted to be hypotensive with an rhabdomyolysis.  Admitted to the hospital where he ended up leaving AMA.  Presented again to med [facility] with shaking and similar complaints and rhabdomyolysis and eventually admitted back again to the hospital.  Patient seen by psychiatry, medications adjusted and Haldol discontinued.  The brain is normal, neurology consulted planning for lumbar puncture.     Assessment & Plan:  Principal Problem:   Neuroleptic malignant syndrome     Altered mental status and tremors - Concerns that this could be medication side effect, neuroleptic malignant syndrome.  At this time no obvious evidence of fevers or rigidity.  Seen by psychiatry-medication adjustments per their service -UA -8/13.  UDS positive for THC. - TSH, B12, folate, ammonia are all normal - MRI of the brain is negative.  Discussed with neurology, will plan on lumbar puncture.   Rhabdomyolysis -Admission CPK 1589, improving with IV fluids   Hypokalemia -Replete   History of HIV Untreated since February 2023.  Seems interested in starting therapy.  Will consult ID   Patient attempting to leave AMA, IVC completed overnight.    DVT prophylaxis: enoxaparin (LOVENOX) injection 40 mg Start:  09/09/22 1200 SCDs Start: 09/09/22 1058 Code Status: Full code Family Communication:   On going management for AMS               Diet Orders (From admission, onward)     Start     Ordered   09/09/22 1057  Diet regular Fluid consistency: Thin  Diet effective now       Question:  Fluid consistency:  Answer:  Thin   09/09/22 1056            Subjective:  Doing okay, no complaints at the moment.  Examination:  General exam: Appears calm and comfortable  Respiratory system: Clear to auscultation. Respiratory effort normal. Cardiovascular system: S1 & S2 heard, RRR. No JVD, murmurs, rubs, gallops or clicks. No pedal edema. Gastrointestinal system: Abdomen is nondistended, soft and nontender. No organomegaly or masses felt. Normal bowel sounds heard. Central nervous system: Alert and oriented. No focal neurological deficits. Extremities: Symmetric 5 x 5 power. Skin: No rashes, lesions or ulcers Psychiatry: Judgement and insight appear normal. Mood & affect appropriate.  Objective: Vitals:   09/12/22 0600 09/12/22 0700 09/12/22 0800 09/12/22 0900  BP: (!) 96/57  (!) 89/44 101/66  Pulse: (!) 52 (!) 51    Resp: 14 14 15 15   Temp:   97.8 F (36.6 C)   TempSrc:   Oral   SpO2: 99% 97%  98%  Weight:      Height:        Intake/Output Summary (Last 24 hours) at 09/12/2022 1117 Last data filed at  09/12/2022 0736 Gross per 24 hour  Intake 1922.86 ml  Output 2950 ml  Net -1027.14 ml   Filed Weights   09/09/22 1557  Weight: 59.6 kg    Scheduled Meds:  benztropine  1 mg Oral BID   Chlorhexidine Gluconate Cloth  6 each Topical Daily   enoxaparin (LOVENOX) injection  40 mg Subcutaneous Q24H   feeding supplement  237 mL Oral BID BM   Continuous Infusions:  sodium chloride Stopped (09/10/22 1450)   dextrose 5 % and 0.9 % NaCl 75 mL/hr at 09/12/22 0600    Nutritional status     Body mass index is 21.87 kg/m.  Data Reviewed:   CBC: Recent Labs  Lab  09/06/22 1347 09/08/22 2300 09/10/22 0243 09/11/22 0251 09/12/22 0301  WBC 4.5 6.5 4.9 5.1 3.6*  NEUTROABS  --  3.4  --   --   --   HGB 9.5* 10.2* 8.9* 9.8* 9.4*  HCT 28.5* 30.5* 27.4* 30.4* 28.8*  MCV 91.1 91.3 95.8 95.3 93.5  PLT 168 207 180 206 214   Basic Metabolic Panel: Recent Labs  Lab 09/06/22 1347 09/08/22 2300 09/10/22 0243 09/11/22 0251 09/12/22 0301  NA 136 136 136 137 137  K 3.5 3.1* 3.2* 3.4* 3.3*  CL 104 97* 103 107 103  CO2 25 28 27 24 28   GLUCOSE 84 92 86 90 96  BUN 8 6 6  <5* <5*  CREATININE 0.97 0.91 0.94 0.92 0.99  CALCIUM 8.4* 9.1 8.1* 8.5* 8.6*  MG 2.0  --   --  1.9 1.9  PHOS 3.4  --   --  2.7  --    GFR: Estimated Creatinine Clearance: 94.5 mL/min (by C-G formula based on SCr of 0.99 mg/dL). Liver Function Tests: Recent Labs  Lab 09/06/22 0108 09/06/22 1347 09/08/22 2300  AST 39 55* 53*  ALT 16 20 23   ALKPHOS 29* 23* 33*  BILITOT 0.2* 0.6 0.3  PROT 8.6* 6.5 8.1  ALBUMIN 4.2 3.1* 4.1   No results for input(s): "LIPASE", "AMYLASE" in the last 168 hours. Recent Labs  Lab 09/06/22 1347 09/10/22 0834  AMMONIA 23 18   Coagulation Profile: No results for input(s): "INR", "PROTIME" in the last 168 hours. Cardiac Enzymes: Recent Labs  Lab 09/06/22 1347 09/08/22 2300 09/10/22 0243 09/11/22 0251 09/12/22 0301  CKTOTAL 2,384* 1,589* 1,064* 901* 593*   BNP (last 3 results) No results for input(s): "PROBNP" in the last 8760 hours. HbA1C: No results for input(s): "HGBA1C" in the last 72 hours. CBG: Recent Labs  Lab 09/11/22 1635 09/11/22 1939 09/12/22 0011 09/12/22 0342 09/12/22 0732  GLUCAP 76 98 79 84 85   Lipid Profile: No results for input(s): "CHOL", "HDL", "LDLCALC", "TRIG", "CHOLHDL", "LDLDIRECT" in the last 72 hours. Thyroid Function Tests: No results for input(s): "TSH", "T4TOTAL", "FREET4", "T3FREE", "THYROIDAB" in the last 72 hours. Anemia Panel: Recent Labs    09/10/22 0834 09/11/22 0251  VITAMINB12 191  --    FOLATE  --  7.6   Sepsis Labs: Recent Labs  Lab 09/06/22 1632 09/06/22 1748  LATICACIDVEN 1.0 1.1    Recent Results (from the past 240 hour(s))  MRSA Next Gen by PCR, Nasal     Status: None   Collection Time: 09/09/22 11:17 AM   Specimen: Nasal Mucosa; Nasal Swab  Result Value Ref Range Status   MRSA by PCR Next Gen NOT DETECTED NOT DETECTED Final    Comment: (NOTE) The GeneXpert MRSA Assay (FDA approved for NASAL specimens only), is  one component of a comprehensive MRSA colonization surveillance program. It is not intended to diagnose MRSA infection nor to guide or monitor treatment for MRSA infections. Test performance is not FDA approved in patients less than 71 years old. Performed at [facility], [address], [place], [place] [address]          Radiology Studies: MR BRAIN W WO CONTRAST  Result Date: 09/11/2022 CLINICAL DATA:  Meningitis/CNS infection suspected. EXAM: MRI HEAD WITHOUT AND WITH CONTRAST TECHNIQUE: Multiplanar, multiecho pulse sequences of the brain and surrounding structures were obtained without and with intravenous contrast. CONTRAST:  6mL GADAVIST GADOBUTROL 1 MMOL/ML IV SOLN COMPARISON:  Head CT 09/06/2022. FINDINGS: Brain: No acute infarct or hemorrhage. No hydrocephalus or extra-axial collection. No foci of abnormal susceptibility. No mass or abnormal enhancement. Vascular: Normal flow voids and vessel enhancement. Skull and upper cervical spine: Normal marrow signal and enhancement. Sinuses/Orbits: No acute findings. Other: None. IMPRESSION: Normal brain MRI. Electronically Signed   By: Orvan Falconer M.D.   On: 09/11/2022 17:07           LOS: 3 days   Time spent= 35 mins    Miguel Rota, MD [facility]  If 7PM-7AM, please contact night-coverage  09/12/2022, 11:17 AM

## 2022-09-12 NOTE — TOC CM/SW Note (Signed)
Transition of Care [facility]) - Inpatient Brief Assessment   Patient Details  Name: Gary Bird MRN: [id number] Date of Birth: 1995-04-10  Transition of Care [facility]) CM/SW Contact:    Otelia Santee, LCSW Phone Number: 09/12/2022, 2:17 PM   Clinical Narrative: Pt currently under IVC.  No TOC needs identified at this time.    Transition of Care Asessment: Insurance and Status: Insurance coverage has been reviewed Patient has primary care physician: No Home environment has been reviewed: Home Prior level of function:: Independent Prior/Current Home Services: No current home services Social Determinants of Health Reivew: SDOH reviewed no interventions necessary Readmission risk has been reviewed: Yes Transition of care needs: no transition of care needs at this time

## 2022-09-12 NOTE — Plan of Care (Signed)
Discussed with parent in front of patient plan of care for the evening, pain management and medications with some teach back displayed by the parent.  What is important to patient is to encourage ensures and snacks to maintain blood glucose levels (patient appetite is diminished at this time and was prior to admission per mom).  Problem: Education: Goal: Knowledge of General Education information will improve Description: Including pain rating scale, medication(s)/side effects and non-pharmacologic comfort measures Outcome: Progressing   Problem: Nutrition: Goal: Adequate nutrition will be maintained Outcome: Not Progressing   Problem: Coping: Goal: Level of anxiety will decrease Outcome: Progressing   Problem: Pain Management: Goal: General experience of comfort will improve Outcome: Progressing

## 2022-09-12 NOTE — Consult Note (Addendum)
[facility] Face-to-Face Psychiatry Consult   Reason for Consult: ''Paranoid Schizophrenia, complaint of possible reaction to Haldol Depot.'' Referring Physician:  Stephania Fragmin, MD Patient Identification: Gary Bird MRN:  [id number] Principal Diagnosis: Neuroleptic malignant syndrome Diagnosis:  Principal Problem:   Neuroleptic malignant syndrome Active Problems:   Antipsychotic-induced neurological movement disorder   Total Time spent with patient: 1 hour  Subjective:   Gary Bird is a 27 y.o. male patient admitted with tremors and altered mental status.  Patient is alert and oriented x4, calm and cooperative, very attentive and engages well with psychiatric nurse practitioner.   On today's evaluation he is observed to be lying in bed in his room. He appears to be resting well however awakens when name is called. He appears to be receptive to current situation at hand to include ongoing medical clearance by neurology.  He denies any acute psychiatric symptoms that include suicidal ideations, homicidal ideations, and or auditory or visual hallucinations.  Patient denies any complaints and or questions. He does mention wanting to stay in bed but is encouraged to start working with physical therapy to preserve his strength and mobility.   He is very appropriate and does seem to have a linear conversation.  There does not appear to be any evidence of confabulation, psychosis, delusional thinking.  He does not appear to be responding to internal stimuli, external stimuli.  He is able to engage well and follow all commands.  He further denies any thoughts to want to harm himself or other people.  There appear to be no evidence of tremors, and vital signs are within normal range at this time. Will psychiatrically clear at this time.  This does not appear to be acute exacerbation of muscular rigidity, exposure to long acting depot, and elevated CK levels are resolving. He has shown much improvement  over the weekend and now appears to be nearing his baseline from a psychiatric standpoint.   HPI: Patient is 27 year old male with history of untreated HIV, ADHD, depression, history of syphilis, paranoid schizophrenia. He was who brought to the hospital for treatment of extrapyramidal symptoms secondary to Haldol deconate injection that was administered days prior to discharge from [facility] where he was hospitalized from August 4 th till 8th.  Patient is a poor historian, collateral information obtained from his mother revealed that he has a long history of Schizophrenia with poor compliance with medications. She states that patient was off his medications since February, 2023 before it was restarted on August 4 th, 2024 at [facility].On the day of admission to [facility], mother reports that patient was confused, disoriented,  lethargic with some staring spell and tremulous. Today patient alert, awake, oriented, calm and cooperative. Patient is more coherent, now speaking clearly with decreased tremors and normal posture.He denies psychosis, anxiety, depression, paranoia, suicidal/homicidal ideations, intent or plan.  Past Psychiatric History: as above  Risk to Self:  denies Risk to Others:  denies Prior Inpatient Therapy:  yes Prior Outpatient Therapy:  [facility]   Past Medical History:  Past Medical History:  Diagnosis Date   ADHD (attention deficit hyperactivity disorder)    Depression    HIV positive (HCC)    Paranoid schizophrenia (HCC)     Past Surgical History:  Procedure Laterality Date   ADENOIDECTOMY W/ MYRINGOTOMY     Family History: History reviewed. No pertinent family history. Family Psychiatric  History: Denies  Social History:  Social History   Substance  and Sexual Activity  Alcohol Use Yes   Comment: occ     Social History   Substance and Sexual Activity  Drug Use No    Social History   Socioeconomic History   Marital  status: Single    Spouse name: Not on file   Number of children: Not on file   Years of education: Not on file   Highest education level: Not on file  Occupational History   Not on file  Tobacco Use   Smoking status: Every Day    Types: Cigarettes   Smokeless tobacco: Never  Vaping Use   Vaping status: Some Days  Substance and Sexual Activity   Alcohol use: Yes    Comment: occ   Drug use: No   Sexual activity: Not on file  Other Topics Concern   Not on file  Social History Narrative   Not on file   Social Determinants of Health   Financial Resource Strain: Not on file  Food Insecurity: Patient Unable To Answer (09/09/2022)   Hunger Vital Sign    Worried About Running Out of Food in the Last Year: Patient unable to answer    Ran Out of Food in the Last Year: Patient unable to answer  Transportation Needs: Patient Unable To Answer (09/09/2022)   PRAPARE - Transportation    Lack of Transportation (Medical): Patient unable to answer    Lack of Transportation (Non-Medical): Patient unable to answer  Physical Activity: Not on file  Stress: Not on file  Social Connections: Not on file   Additional Social History:    Allergies:   Allergies  Allergen Reactions   Latex Rash    Labs:  Results for orders placed or performed during the hospital encounter of 09/08/22 (from the past 48 hour(s))  Glucose, capillary     Status: None   Collection Time: 09/10/22 11:53 AM  Result Value Ref Range   Glucose-Capillary 81 70 - 99 mg/dL    Comment: Glucose reference range applies only to samples taken after fasting for at least 8 hours.  Glucose, capillary     Status: None   Collection Time: 09/10/22  4:08 PM  Result Value Ref Range   Glucose-Capillary 73 70 - 99 mg/dL    Comment: Glucose reference range applies only to samples taken after fasting for at least 8 hours.   Comment 1 Notify RN    Comment 2 Document in Chart   Glucose, capillary     Status: None   Collection Time:  09/10/22  8:21 PM  Result Value Ref Range   Glucose-Capillary 86 70 - 99 mg/dL    Comment: Glucose reference range applies only to samples taken after fasting for at least 8 hours.  Glucose, capillary     Status: None   Collection Time: 09/10/22 11:59 PM  Result Value Ref Range   Glucose-Capillary 82 70 - 99 mg/dL    Comment: Glucose reference range applies only to samples taken after fasting for at least 8 hours.  CK     Status: Abnormal   Collection Time: 09/11/22  2:51 AM  Result Value Ref Range   Total CK 901 (H) 49 - 397 U/L    Comment: Performed at [facility], [address], [place], [place] [address]  Magnesium     Status: None   Collection Time: 09/11/22  2:51 AM  Result Value Ref Range   Magnesium 1.9 1.7 - 2.4 mg/dL    Comment: Performed at  [facility], [address] [place], [place] [address]  Basic metabolic panel     Status: Abnormal   Collection Time: 09/11/22  2:51 AM  Result Value Ref Range   Sodium 137 135 - 145 mmol/L   Potassium 3.4 (L) 3.5 - 5.1 mmol/L   Chloride 107 98 - 111 mmol/L   CO2 24 22 - 32 mmol/L   Glucose, Bld 90 70 - 99 mg/dL    Comment: Glucose reference range applies only to samples taken after fasting for at least 8 hours.   BUN <5 (L) 6 - 20 mg/dL   Creatinine, Ser 1.19 0.61 - 1.24 mg/dL   Calcium 8.5 (L) 8.9 - 10.3 mg/dL   GFR, Estimated >14 >78 mL/min    Comment: (NOTE) Calculated using the CKD-EPI Creatinine Equation (2021)    Anion gap 6 5 - 15    Comment: Performed at [facility], [address], [place], [place] [address]  CBC     Status: Abnormal   Collection Time: 09/11/22  2:51 AM  Result Value Ref Range   WBC 5.1 4.0 - 10.5 K/uL   RBC 3.19 (L) 4.22 - 5.81 MIL/uL   Hemoglobin 9.8 (L) 13.0 - 17.0 g/dL   HCT 13.0 (L) 86.5 - 78.4 %   MCV 95.3 80.0 - 100.0 fL   MCH 30.7 26.0 - 34.0 pg   MCHC 32.2 30.0 - 36.0 g/dL   RDW 69.6 29.5 - 28.4 %   Platelets 206 150 - 400  K/uL   nRBC 0.0 0.0 - 0.2 %    Comment: Performed at [facility], [address] [place], [place] [address]  Phosphorus     Status: None   Collection Time: 09/11/22  2:51 AM  Result Value Ref Range   Phosphorus 2.7 2.5 - 4.6 mg/dL    Comment: Performed at [facility], [address], [place], [place] [address]  Folate     Status: None   Collection Time: 09/11/22  2:51 AM  Result Value Ref Range   Folate 7.6 >5.9 ng/mL    Comment: Performed at [facility], [address] [place], [place] [address]  Glucose, capillary     Status: None   Collection Time: 09/11/22  4:01 AM  Result Value Ref Range   Glucose-Capillary 89 70 - 99 mg/dL    Comment: Glucose reference range applies only to samples taken after fasting for at least 8 hours.  Glucose, capillary     Status: None   Collection Time: 09/11/22  8:06 AM  Result Value Ref Range   Glucose-Capillary 74 70 - 99 mg/dL    Comment: Glucose reference range applies only to samples taken after fasting for at least 8 hours.   Comment 1 Notify RN    Comment 2 Document in Chart   Glucose, capillary     Status: None   Collection Time: 09/11/22 11:55 AM  Result Value Ref Range   Glucose-Capillary 94 70 - 99 mg/dL    Comment: Glucose reference range applies only to samples taken after fasting for at least 8 hours.   Comment 1 Notify RN    Comment 2 Document in Chart   Glucose, capillary     Status: None   Collection Time: 09/11/22  4:35 PM  Result Value Ref Range   Glucose-Capillary 76 70 - 99 mg/dL    Comment: Glucose reference range applies only to samples taken after fasting for  at least 8 hours.   Comment 1 Notify RN    Comment 2 Document in Chart   Glucose, capillary     Status: None   Collection Time: 09/11/22  7:39 PM  Result Value Ref Range   Glucose-Capillary 98 70 - 99 mg/dL    Comment: Glucose reference range applies only to samples taken after fasting for at least 8  hours.  Glucose, capillary     Status: None   Collection Time: 09/12/22 12:11 AM  Result Value Ref Range   Glucose-Capillary 79 70 - 99 mg/dL    Comment: Glucose reference range applies only to samples taken after fasting for at least 8 hours.  CK     Status: Abnormal   Collection Time: 09/12/22  3:01 AM  Result Value Ref Range   Total CK 593 (H) 49 - 397 U/L    Comment: Performed at [facility], [address], [place], [place] [address]  Magnesium     Status: None   Collection Time: 09/12/22  3:01 AM  Result Value Ref Range   Magnesium 1.9 1.7 - 2.4 mg/dL    Comment: Performed at [facility], [address], [place], [place] [address]  Basic metabolic panel     Status: Abnormal   Collection Time: 09/12/22  3:01 AM  Result Value Ref Range   Sodium 137 135 - 145 mmol/L   Potassium 3.3 (L) 3.5 - 5.1 mmol/L   Chloride 103 98 - 111 mmol/L   CO2 28 22 - 32 mmol/L   Glucose, Bld 96 70 - 99 mg/dL    Comment: Glucose reference range applies only to samples taken after fasting for at least 8 hours.   BUN <5 (L) 6 - 20 mg/dL   Creatinine, Ser 8.84 0.61 - 1.24 mg/dL   Calcium 8.6 (L) 8.9 - 10.3 mg/dL   GFR, Estimated >16 >60 mL/min    Comment: (NOTE) Calculated using the CKD-EPI Creatinine Equation (2021)    Anion gap 6 5 - 15    Comment: Performed at [facility], [address], [place], [place] [address]  CBC     Status: Abnormal   Collection Time: 09/12/22  3:01 AM  Result Value Ref Range   WBC 3.6 (L) 4.0 - 10.5 K/uL   RBC 3.08 (L) 4.22 - 5.81 MIL/uL   Hemoglobin 9.4 (L) 13.0 - 17.0 g/dL   HCT 01.0 (L) 93.2 - 35.5 %   MCV 93.5 80.0 - 100.0 fL   MCH 30.5 26.0 - 34.0 pg   MCHC 32.6 30.0 - 36.0 g/dL   RDW 73.2 20.2 - 54.2 %   Platelets 214 150 - 400 K/uL   nRBC 0.0 0.0 - 0.2 %    Comment: Performed at [facility], [address] [place], [place] [address]  Cd4/cd8 (t-helper/t-suppressor cell)      Status: Abnormal   Collection Time: 09/12/22  3:01 AM  Result Value Ref Range   Total lymphocyte count 1,596 1,000 - 4,000 /uL   CD4% 26.29 (L) 33 - 65 %   CD4 absolute 419 400 - 1,790 /uL   CD8tox 52.01 (H) 12 - 40 %   CD8 T Cell Abs 830 190 - 1,000 /uL   Ratio 0.51 (L) 1.0 - 3.0    Comment: Performed at [facility], [address] [place], [place] [address]  Glucose, capillary     Status: None   Collection Time: 09/12/22  3:42 AM  Result Value Ref Range   Glucose-Capillary 84 70 - 99 mg/dL    Comment: Glucose reference range applies only to samples taken after fasting for at least 8 hours.  Glucose, capillary     Status: None   Collection Time: 09/12/22  7:32 AM  Result Value Ref Range   Glucose-Capillary 85 70 - 99 mg/dL    Comment: Glucose reference range applies only to samples taken after fasting for at least 8 hours.    Current Facility-Administered Medications  Medication Dose Route Frequency Provider Last Rate Last Admin   0.9 %  sodium chloride infusion   Intravenous Continuous Kirby Crigler, Mir M, MD   Stopped at 09/10/22 1450   acetaminophen (TYLENOL) tablet 650 mg  650 mg Oral Q6H PRN Kirby Crigler, Mir M, MD       Or   acetaminophen (TYLENOL) suppository 650 mg  650 mg Rectal Q6H PRN Kirby Crigler, Mir M, MD       benztropine (COGENTIN) tablet 1 mg  1 mg Oral BID Akintayo, Mojeed, MD   1 mg at 09/12/22 1019   Chlorhexidine Gluconate Cloth 2 % PADS 6 each  6 each Topical Daily Sundil, Subrina, MD   6 each at 09/11/22 1627   dextrose 5 %-0.9 % sodium chloride infusion   Intravenous Continuous Amin, Ankit C, MD 75 mL/hr at 09/12/22 0600 Infusion Verify at 09/12/22 0600   enoxaparin (LOVENOX) injection 40 mg  40 mg Subcutaneous Q24H Kirby Crigler, Mir M, MD   40 mg at 09/11/22 1257   feeding supplement (ENSURE ENLIVE / ENSURE PLUS) liquid 237 mL  237 mL Oral BID BM Amin, Ankit C, MD   237 mL at 09/12/22 1020   glucagon (human recombinant) (GLUCAGEN) injection 1 mg   1 mg Intravenous PRN Amin, Ankit C, MD       guaiFENesin (ROBITUSSIN) 100 MG/5ML liquid 5 mL  5 mL Oral Q4H PRN Amin, Ankit C, MD       hydrALAZINE (APRESOLINE) injection 10 mg  10 mg Intravenous Q4H PRN Amin, Ankit C, MD       ipratropium-albuterol (DUONEB) 0.5-2.5 (3) MG/3ML nebulizer solution 3 mL  3 mL Nebulization Q4H PRN Amin, Ankit C, MD       metoprolol tartrate (LOPRESSOR) injection 5 mg  5 mg Intravenous Q4H PRN Amin, Ankit C, MD       ondansetron (ZOFRAN) tablet 4 mg  4 mg Oral Q6H PRN Kirby Crigler, Mir M, MD       Or   ondansetron [facility]) injection 4 mg  4 mg Intravenous Q6H PRN Kirby Crigler, Mir M, MD       Oral care mouth rinse  15 mL Mouth Rinse PRN Sundil, Subrina, MD       senna-docusate (Senokot-S) tablet 1 tablet  1 tablet Oral QHS PRN Amin, Ankit C, MD        Musculoskeletal: Strength & Muscle Tone: within normal limits Gait & Station: normal Patient leans: N/A    Psychiatric Specialty Exam:  Presentation  General Appearance:  Bizarre  Eye Contact: Fair  Speech: Slow; Clear and Coherent  Speech Volume: Decreased  Handedness: Right   Mood and Affect  Mood: Dysphoric  Affect: Flat   Thought Process  Thought Processes: Coherent; Linear  Descriptions of Associations:Intact  Orientation:Full (Time, Place and Person)  Thought Content:Logical  History of Schizophrenia/Schizoaffective disorder: Yes Duration of Psychotic Symptoms: >6 months Hallucinations:Hallucinations: None  Ideas of Reference:None  Suicidal Thoughts:Suicidal Thoughts: No  Homicidal Thoughts:Homicidal Thoughts: No  Sensorium  Memory: Immediate Fair; Recent Fair; Remote Fair  Judgment: Fair  Insight: Present   Executive Functions  Concentration: Fair  Attention Span: Good  Recall: Good  Fund of Knowledge: Good  Language: Good   Psychomotor Activity  Psychomotor Activity: Psychomotor Activity: Normal   Assets  Assets: Communication Skills;  Desire for Improvement   Sleep  Sleep: Sleep: Fair   Physical Exam: Physical Exam Vitals and nursing note reviewed.  Constitutional:      General: He is sleeping.     Appearance: Normal appearance. He is normal weight.  HENT:     Mouth/Throat:     Mouth: Mucous membranes are dry.  Skin:    General: Skin is warm and dry.  Neurological:     General: No focal deficit present.     Mental Status: He is oriented to person, place, and time and easily aroused. Mental status is at baseline.  Psychiatric:        Mood and Affect: Mood normal.        Behavior: Behavior normal. Behavior is cooperative.        Thought Content: Thought content normal.        Judgment: Judgment normal.    Review of Systems  Constitutional: Negative.   Psychiatric/Behavioral:  Positive for substance abuse (THC). Negative for depression, hallucinations, memory loss and suicidal ideas. The patient is nervous/anxious (when the shaking starts). The patient does not have insomnia.   All other systems reviewed and are negative.  Blood pressure 101/66, pulse (!) 51, temperature 97.8 F (36.6 C), temperature source Oral, resp. rate 15, height 5\' 5"  (1.651 m), weight 59.6 kg, SpO2 98%. Body mass index is 21.87 kg/m.  Treatment Plan Summary:  Provisional diagnosis: Antipsychotic-induced neurological movement disorder   Plan/recommendations: Agree with neurological work up to rule out infectious or inflammatory process.  -Consider referral to speech therapy for speech evaluation due to poor verbal output -Consider physical/occupational therapy-to prevent physical debility and decompensation -Consider lorazepam if tremors return, patient reports almost complete cessation.  -Continue Benztropine 1 mg BID for extrapyramidal effects -D/C Haldol decanoate -Monitor CK level daily, today has continued to decline (1610>960) - Strongly encourage ongoing monitoring and searching for additional organic causes other than  medications.  -EKG obtained on 08/16, QT 458.Patient remains at risk for QTc prolongation which warrant EKG monitoring  Disposition: - Psychiatry will sign off at this time. Patient is symptom free of NMS like symptoms at this time. As previously mentioned Haldol dec is long acting, injection peaks at about 6 days. Recommend close outpatient follow up at discharge and consider other safer agents. Will likely update allergies to reflect adverse reactions for Haldol deconate.   Maryagnes Amos, FNP 09/12/2022 10:59 AM

## 2022-09-12 NOTE — TOC Benefit Eligibility Note (Signed)
Patient Product/process development scientist completed.    The patient is insured through [employer] [place].     Ran test claim for [employer] and the current 30 day co-pay is $0.00.   This test claim was processed through [facility]- copay amounts may vary at other pharmacies due to pharmacy/plan contracts, or as the patient moves through the different stages of their insurance plan.     Roland Earl, CPHT Pharmacy Technician III Certified Patient Advocate [facility] Patient Advocate Team Direct Number: [phone]  Fax: [phone]

## 2022-09-13 ENCOUNTER — Inpatient Hospital Stay (HOSPITAL_COMMUNITY): Payer: MEDICAID

## 2022-09-13 DIAGNOSIS — G210 Malignant neuroleptic syndrome: Secondary | ICD-10-CM | POA: Diagnosis not present

## 2022-09-13 LAB — CBC
HCT: 30.7 % — ABNORMAL LOW (ref 39.0–52.0)
Hemoglobin: 10 g/dL — ABNORMAL LOW (ref 13.0–17.0)
MCH: 30.1 pg (ref 26.0–34.0)
MCHC: 32.6 g/dL (ref 30.0–36.0)
MCV: 92.5 fL (ref 80.0–100.0)
Platelets: 239 10*3/uL (ref 150–400)
RBC: 3.32 MIL/uL — ABNORMAL LOW (ref 4.22–5.81)
RDW: 12.3 % (ref 11.5–15.5)
WBC: 4.2 10*3/uL (ref 4.0–10.5)
nRBC: 0 % (ref 0.0–0.2)

## 2022-09-13 LAB — GLUCOSE, CAPILLARY
Glucose-Capillary: 94 mg/dL (ref 70–99)
Glucose-Capillary: 90 mg/dL (ref 70–99)
Glucose-Capillary: 93 mg/dL (ref 70–99)
Glucose-Capillary: 95 mg/dL (ref 70–99)
Glucose-Capillary: 102 mg/dL — ABNORMAL HIGH (ref 70–99)

## 2022-09-13 LAB — BASIC METABOLIC PANEL
Anion gap: 7 (ref 5–15)
BUN: 7 mg/dL (ref 6–20)
CO2: 29 mmol/L (ref 22–32)
Calcium: 9.1 mg/dL (ref 8.9–10.3)
Chloride: 99 mmol/L (ref 98–111)
Creatinine, Ser: 1.07 mg/dL (ref 0.61–1.24)
GFR, Estimated: >60 mL/min (ref >60)
Glucose, Bld: 91 mg/dL (ref 70–99)
Potassium: 3.7 mmol/L (ref 3.5–5.1)
Sodium: 135 mmol/L (ref 135–145)

## 2022-09-13 LAB — MAGNESIUM: Magnesium: 1.8 mg/dL (ref 1.7–2.4)

## 2022-09-13 LAB — CK: Total CK: 408 U/L — ABNORMAL HIGH (ref 49–397)

## 2022-09-13 NOTE — Plan of Care (Signed)
  Problem: Education: Goal: Knowledge of General Education information will improve Description: Including pain rating scale, medication(s)/side effects and non-pharmacologic comfort measures Outcome: Progressing   Problem: Coping: Goal: Level of anxiety will decrease Outcome: Progressing   Problem: Elimination: Goal: Will not experience complications related to urinary retention Outcome: Progressing   Problem: Pain Managment: Goal: General experience of comfort will improve Outcome: Progressing   Problem: Safety: Goal: Ability to remain free from injury will improve Outcome: Progressing   Problem: Nutrition: Goal: Adequate nutrition will be maintained Outcome: Not Progressing

## 2022-09-13 NOTE — Progress Notes (Signed)
PROGRESS NOTE    Gary Bird  WJX:[id number] DOB: 1995/12/06 DOA: 09/08/2022 PCP: Patient, No Pcp Per     Brief Narrative:  27 year old with history of untreated HIV, ADHD, depression, history of syphilis, paranoid schizophrenia comes to the hospital with altered mental status and tremors.  He was admitted to [facility] on 8/13 for same complaints.  He has been noncompliant with his HIV medications for several months now.  He was IVC on 8/4 and hospitalized until 8/9 in behavioral unit in [facility].  On discharge she was given 200 mg of IM Haldol Depo shot.  On the day of admission mother noted that he was very confused lethargic with some staring spell therefore brought him to the hospital.  He was noted to be hypotensive with an rhabdomyolysis.  Admitted to the hospital where he ended up leaving AMA.  Presented again to med [facility] with shaking and similar complaints and rhabdomyolysis and eventually admitted back again to the hospital.  Patient seen by psychiatry, medications adjusted and Haldol discontinued.  The brain is normal, neurology consulted, MRI and EEG are neg. LP has been ordered. Seen by ID as well.      Assessment & Plan:  Principal Problem:   Neuroleptic malignant syndrome     Altered mental status and tremors - Concerns that this could be medication side effect, neuroleptic malignant syndrome.  At this time no obvious evidence of fevers or rigidity.  Seen by psychiatry-medication adjustments per their service -UA -8/13.  UDS positive for THC. - TSH, B12, folate, ammonia are all normal - MRI of the brain is negative.  EEG is neg. LP ordered, Labs ordered by Neurology.  Neuro should discuss with Radiology to see who will do the LP   Rhabdomyolysis -Admission CPK 1589, improving with IV fluids   Hypokalemia -Replete   History of HIV Untreated since February 2023. ID consulted. Planning on restarting his Tx.    Patient attempting to leave AMA, IVC  completed overnight.    DVT prophylaxis: enoxaparin (LOVENOX) injection 40 mg Start: 09/09/22 1200 SCDs Start: 09/09/22 1058 Code Status: Full code Family Communication:   On going management for AMS               Diet Orders (From admission, onward)     Start     Ordered   09/09/22 1057  Diet regular Fluid consistency: Thin  Diet effective now       Question:  Fluid consistency:  Answer:  Thin   09/09/22 1056            Subjective:  No complains  Examination:  General exam: Appears calm and comfortable  Respiratory system: Clear to auscultation. Respiratory effort normal. Cardiovascular system: S1 & S2 heard, RRR. No JVD, murmurs, rubs, gallops or clicks. No pedal edema. Gastrointestinal system: Abdomen is nondistended, soft and nontender. No organomegaly or masses felt. Normal bowel sounds heard. Central nervous system: Alert and oriented. No focal neurological deficits. Extremities: Symmetric 4 x 5 power. Skin: No rashes, lesions or ulcers Psychiatry: Judgement and insight appear normal. forgetful  Objective: Vitals:   09/13/22 0612 09/13/22 0700 09/13/22 0753 09/13/22 0800  BP:    (!) 87/61  Pulse: 91 (!) 56  (!) 58  Resp:      Temp:   98.1 F (36.7 C)   TempSrc:      SpO2: 99% 100%  99%  Weight:      Height:  Intake/Output Summary (Last 24 hours) at 09/13/2022 1227 Last data filed at 09/13/2022 0600 Gross per 24 hour  Intake 2624.05 ml  Output 1775 ml  Net 849.05 ml   Filed Weights   09/09/22 1557  Weight: 59.6 kg    Scheduled Meds:  benztropine  1 mg Oral BID   bictegravir-emtricitabine-tenofovir AF  1 tablet Oral Daily   Chlorhexidine Gluconate Cloth  6 each Topical Daily   enoxaparin (LOVENOX) injection  40 mg Subcutaneous Q24H   feeding supplement  237 mL Oral BID BM   Continuous Infusions:  sodium chloride Stopped (09/10/22 1450)   dextrose 5 % and 0.9 % NaCl 75 mL/hr at 09/13/22 0657    Nutritional status     Body  mass index is 21.87 kg/m.  Data Reviewed:   CBC: Recent Labs  Lab 09/08/22 2300 09/10/22 0243 09/11/22 0251 09/12/22 0301 09/13/22 0635  WBC 6.5 4.9 5.1 3.6* 4.2  NEUTROABS 3.4  --   --   --   --   HGB 10.2* 8.9* 9.8* 9.4* 10.0*  HCT 30.5* 27.4* 30.4* 28.8* 30.7*  MCV 91.3 95.8 95.3 93.5 92.5  PLT 207 180 206 214 239   Basic Metabolic Panel: Recent Labs  Lab 09/06/22 1347 09/08/22 2300 09/10/22 0243 09/11/22 0251 09/12/22 0301 09/13/22 0635  NA 136 136 136 137 137 135  K 3.5 3.1* 3.2* 3.4* 3.3* 3.7  CL 104 97* 103 107 103 99  CO2 25 28 27 24 28 29   GLUCOSE 84 92 86 90 96 91  BUN 8 6 6  <5* <5* 7  CREATININE 0.97 0.91 0.94 0.92 0.99 1.07  CALCIUM 8.4* 9.1 8.1* 8.5* 8.6* 9.1  MG 2.0  --   --  1.9 1.9 1.8  PHOS 3.4  --   --  2.7  --   --    GFR: Estimated Creatinine Clearance: 87.4 mL/min (by C-G formula based on SCr of 1.07 mg/dL). Liver Function Tests: Recent Labs  Lab 09/06/22 1347 09/08/22 2300  AST 55* 53*  ALT 20 23  ALKPHOS 23* 33*  BILITOT 0.6 0.3  PROT 6.5 8.1  ALBUMIN 3.1* 4.1   No results for input(s): "LIPASE", "AMYLASE" in the last 168 hours. Recent Labs  Lab 09/06/22 1347 09/10/22 0834  AMMONIA 23 18   Coagulation Profile: No results for input(s): "INR", "PROTIME" in the last 168 hours. Cardiac Enzymes: Recent Labs  Lab 09/08/22 2300 09/10/22 0243 09/11/22 0251 09/12/22 0301 09/13/22 0635  CKTOTAL 1,589* 1,064* 901* 593* 408*   BNP (last 3 results) No results for input(s): "PROBNP" in the last 8760 hours. HbA1C: No results for input(s): "HGBA1C" in the last 72 hours. CBG: Recent Labs  Lab 09/12/22 1651 09/12/22 2004 09/12/22 2306 09/13/22 0337 09/13/22 1146  GLUCAP 102* 116* 88 94 90   Lipid Profile: No results for input(s): "CHOL", "HDL", "LDLCALC", "TRIG", "CHOLHDL", "LDLDIRECT" in the last 72 hours. Thyroid Function Tests: No results for input(s): "TSH", "T4TOTAL", "FREET4", "T3FREE", "THYROIDAB" in the last 72  hours. Anemia Panel: Recent Labs    09/11/22 0251  FOLATE 7.6   Sepsis Labs: Recent Labs  Lab 09/06/22 1632 09/06/22 1748  LATICACIDVEN 1.0 1.1    Recent Results (from the past 240 hour(s))  MRSA Next Gen by PCR, Nasal     Status: None   Collection Time: 09/09/22 11:17 AM   Specimen: Nasal Mucosa; Nasal Swab  Result Value Ref Range Status   MRSA by PCR Next Gen NOT DETECTED NOT DETECTED Final  Comment: (NOTE) The GeneXpert MRSA Assay (FDA approved for NASAL specimens only), is one component of a comprehensive MRSA colonization surveillance program. It is not intended to diagnose MRSA infection nor to guide or monitor treatment for MRSA infections. Test performance is not FDA approved in patients less than 56 years old. Performed at [facility], [address], [place], [place] [address]          Radiology Studies: EEG adult  Result Date: 10-04-2022 Charlsie Quest, MD     Oct 04, 2022  8:59 PM Patient Name: Gary Bird MRN: [id number] Epilepsy Attending: Charlsie Quest Referring Physician/Provider: Marjorie Smolder, NP Date: 2022-10-04 Duration: 24.37 mins Patient history:  26 y.o. male with history of HIV untreated since last year, ADHD, depression, syphilis and paranoid schizophrenia who presented with altered mental status and tremors. EEG to evaluate for seizure. Level of alertness: Awake, drowsy AEDs during EEG study: Ativan Technical aspects: This EEG study was done with scalp electrodes positioned according to the 10-20 International system of electrode placement. Electrical activity was reviewed with band pass filter of 1-70Hz , sensitivity of 7 uV/mm, display speed of 52mm/sec with a 60Hz  notched filter applied as appropriate. EEG data were recorded continuously and digitally stored.  Video monitoring was available and reviewed as appropriate. Description: The posterior dominant rhythm consists of 9 Hz activity of moderate voltage (25-35  uV) seen predominantly in posterior head regions, symmetric and reactive to eye opening and eye closing. Drowsiness was characterized by attenuation of the posterior background rhythm. Hyperventilation and photic stimulation were not performed.   IMPRESSION: This study is within normal limits. No seizures or epileptiform discharges were seen throughout the recording. A normal interictal EEG does not exclude the diagnosis of epilepsy. Charlsie Quest   MR BRAIN W WO CONTRAST  Result Date: 09/11/2022 CLINICAL DATA:  Meningitis/CNS infection suspected. EXAM: MRI HEAD WITHOUT AND WITH CONTRAST TECHNIQUE: Multiplanar, multiecho pulse sequences of the brain and surrounding structures were obtained without and with intravenous contrast. CONTRAST:  6mL GADAVIST GADOBUTROL 1 MMOL/ML IV SOLN COMPARISON:  Head CT 09/06/2022. FINDINGS: Brain: No acute infarct or hemorrhage. No hydrocephalus or extra-axial collection. No foci of abnormal susceptibility. No mass or abnormal enhancement. Vascular: Normal flow voids and vessel enhancement. Skull and upper cervical spine: Normal marrow signal and enhancement. Sinuses/Orbits: No acute findings. Other: None. IMPRESSION: Normal brain MRI. Electronically Signed   By: Orvan Falconer M.D.   On: 09/11/2022 17:07           LOS: 4 days   Time spent= 35 mins    Miguel Rota, MD [facility]  If 7PM-7AM, please contact night-coverage  09/13/2022, 12:27 PM

## 2022-09-14 ENCOUNTER — Inpatient Hospital Stay (HOSPITAL_COMMUNITY): Payer: MEDICAID

## 2022-09-14 DIAGNOSIS — G210 Malignant neuroleptic syndrome: Secondary | ICD-10-CM | POA: Diagnosis not present

## 2022-09-14 LAB — PROTEIN AND GLUCOSE, CSF
Glucose, CSF: 61 mg/dL (ref 40–70)
Total  Protein, CSF: 13 mg/dL — ABNORMAL LOW (ref 15–45)

## 2022-09-14 LAB — BASIC METABOLIC PANEL
Anion gap: 5 (ref 5–15)
BUN: 6 mg/dL (ref 6–20)
CO2: 30 mmol/L (ref 22–32)
Calcium: 8.2 mg/dL — ABNORMAL LOW (ref 8.9–10.3)
Chloride: 99 mmol/L (ref 98–111)
Creatinine, Ser: 1.1 mg/dL (ref 0.61–1.24)
GFR, Estimated: >60 mL/min (ref >60)
Glucose, Bld: 91 mg/dL (ref 70–99)
Potassium: 3.6 mmol/L (ref 3.5–5.1)
Sodium: 134 mmol/L — ABNORMAL LOW (ref 135–145)

## 2022-09-14 LAB — GLUCOSE, CAPILLARY
Glucose-Capillary: 88 mg/dL (ref 70–99)
Glucose-Capillary: 84 mg/dL (ref 70–99)
Glucose-Capillary: 71 mg/dL (ref 70–99)
Glucose-Capillary: 114 mg/dL — ABNORMAL HIGH (ref 70–99)
Glucose-Capillary: 100 mg/dL — ABNORMAL HIGH (ref 70–99)

## 2022-09-14 LAB — CSF CELL COUNT WITH DIFFERENTIAL
RBC Count, CSF: 2 /mm3 — ABNORMAL HIGH
RBC Count, CSF: 3 /mm3 — ABNORMAL HIGH
Tube #: 1
Tube #: 4
WBC, CSF: 3 /mm3 (ref 0–5)
WBC, CSF: 4 /mm3 (ref 0–5)

## 2022-09-14 LAB — CBC
HCT: 28.4 % — ABNORMAL LOW (ref 39.0–52.0)
Hemoglobin: 9.4 g/dL — ABNORMAL LOW (ref 13.0–17.0)
MCH: 30.9 pg (ref 26.0–34.0)
MCHC: 33.1 g/dL (ref 30.0–36.0)
MCV: 93.4 fL (ref 80.0–100.0)
Platelets: 202 10*3/uL (ref 150–400)
RBC: 3.04 MIL/uL — ABNORMAL LOW (ref 4.22–5.81)
RDW: 12.3 % (ref 11.5–15.5)
WBC: 3.7 10*3/uL — ABNORMAL LOW (ref 4.0–10.5)
nRBC: 0 % (ref 0.0–0.2)

## 2022-09-14 LAB — MENINGITIS/ENCEPHALITIS PANEL (CSF)

## 2022-09-14 LAB — HISTOPLASMA ANTIGEN, URINE: Histoplasma Antigen, urine: NEGATIVE (ref <0.2)

## 2022-09-14 LAB — MAGNESIUM: Magnesium: 1.8 mg/dL (ref 1.7–2.4)

## 2022-09-14 LAB — CK: Total CK: 234 U/L (ref 49–397)

## 2022-09-14 LAB — T.PALLIDUM AB, TOTAL: T Pallidum Abs: REACTIVE — AB

## 2022-09-14 NOTE — TOC Progression Note (Signed)
Transition of Care [facility]) - Progression Note    Patient Details  Name: AYUUB SCOMA MRN: [id number] Date of Birth: Dec 29, 1995  Transition of Care [facility]) CM/SW Contact  Otelia Santee, LCSW Phone Number: 09/14/2022, 12:26 PM  Clinical Narrative:    CSW met with pt's mother at bedside at RN request. CSW answered questions regarding follow up care for pt. CSW provided resources for local ACT Teams and for [facility] services.        Expected Discharge Plan and Services                                               Social Determinants of Health (SDOH) Interventions SDOH Screenings   Food Insecurity: Patient Unable To Answer (09/09/2022)  Housing: Patient Unable To Answer (09/09/2022)  Transportation Needs: Patient Unable To Answer (09/09/2022)  Utilities: Patient Unable To Answer (09/09/2022)  Tobacco Use: High Risk (09/08/2022)    Readmission Risk Interventions    09/14/2022   12:26 PM 09/12/2022    2:16 PM  Readmission Risk Prevention Plan  Transportation Screening Complete Complete  PCP or Specialist Appt within 5-7 Days Complete Complete  Home Care Screening Complete Complete  Medication Review (RN CM) Complete Complete

## 2022-09-14 NOTE — Progress Notes (Signed)
PROGRESS NOTE    ROCKET WOLFE  OZH:[id number] DOB: August 26, 1995 DOA: 09/08/2022 PCP: Patient, No Pcp Per     Brief Narrative:  27 year old with history of untreated HIV, ADHD, depression, history of syphilis, paranoid schizophrenia comes to the hospital with altered mental status and tremors.  He was admitted to [facility] on 8/13 for same complaints.  He has been noncompliant with his HIV medications for several months now.  He was IVC on 8/4 and hospitalized until 8/9 in behavioral unit in [facility].  On discharge she was given 200 mg of IM Haldol Depo shot.  On the day of admission mother noted that he was very confused lethargic with some staring spell therefore brought him to the hospital.  He was noted to be hypotensive with an rhabdomyolysis.  Admitted to the hospital where he ended up leaving AMA.  Presented again to med [facility] with shaking and similar complaints and rhabdomyolysis and eventually admitted back again to the hospital.  Patient seen by psychiatry, medications adjusted and Haldol discontinued.  The brain is normal, neurology consulted, MRI and EEG are neg. LP has been ordered. Seen by ID as well.      Assessment & Plan:  Principal Problem:   Neuroleptic malignant syndrome     Altered mental status and tremors - Concerns that this could be medication side effect, neuroleptic malignant syndrome.  At this time no obvious evidence of fevers or rigidity.  Seen by psychiatry-medication adjustments per their service -UA -8/13.  UDS positive for THC. - TSH, B12, folate, ammonia are all normal - MRI of the brain is negative.  EEG is neg. per neurology request, lumbar puncture ordered   Rhabdomyolysis -Admission CPK 1589, improving with IV fluids   Hypokalemia -Replete   History of HIV Untreated since February 2023. ID consulted.  Biktarvy started   Patient attempting to leave AMA, IVC in place   DVT prophylaxis: enoxaparin (LOVENOX) injection 40 mg Start:  09/09/22 1200 SCDs Start: 09/09/22 1058 Code Status: Full code Family Communication: Mother at bedside On going management for AMS               Diet Orders (From admission, onward)     Start     Ordered   09/09/22 1057  Diet regular Fluid consistency: Thin  Diet effective now       Question:  Fluid consistency:  Answer:  Thin   09/09/22 1056            Subjective: Resting comfortably, mother at bedside.  Waiting for his lumbar puncture   Examination:  General exam: Appears calm and comfortable, b/l temporal wasting Respiratory system: Clear to auscultation. Respiratory effort normal. Cardiovascular system: S1 & S2 heard, RRR. No JVD, murmurs, rubs, gallops or clicks. No pedal edema. Gastrointestinal system: Abdomen is nondistended, soft and nontender. No organomegaly or masses felt. Normal bowel sounds heard. Central nervous system: Alert and oriented. No focal neurological deficits. Extremities: Symmetric 5 x 5 power. Skin: No rashes, lesions or ulcers Psychiatry: Judgement and insight appear normal. Mood & affect appropriate.  Objective: Vitals:   09/14/22 0700 09/14/22 0744 09/14/22 0800 09/14/22 0953  BP:   (!) 113/37 113/64  Pulse:  88 (!) 55 73  Resp: 14 18 14 13   Temp:  98.3 F (36.8 C)    TempSrc:  Oral    SpO2:  99% 97% 100%  Weight:      Height:        Intake/Output  Summary (Last 24 hours) at 09/14/2022 1138 Last data filed at 09/14/2022 9629 Gross per 24 hour  Intake 2091.72 ml  Output 750 ml  Net 1341.72 ml   Filed Weights   09/09/22 1557 09/14/22 0500  Weight: 59.6 kg 58.3 kg    Scheduled Meds:  benztropine  1 mg Oral BID   bictegravir-emtricitabine-tenofovir AF  1 tablet Oral Daily   Chlorhexidine Gluconate Cloth  6 each Topical Daily   enoxaparin (LOVENOX) injection  40 mg Subcutaneous Q24H   feeding supplement  237 mL Oral BID BM   Continuous Infusions:  sodium chloride Stopped (09/10/22 1450)   dextrose 5 % and 0.9 % NaCl  75 mL/hr at 09/14/22 1022    Nutritional status     Body mass index is 21.39 kg/m.  Data Reviewed:   CBC: Recent Labs  Lab 09/08/22 2300 09/10/22 0243 09/11/22 0251 09/12/22 0301 09/13/22 0635 09/14/22 0847  WBC 6.5 4.9 5.1 3.6* 4.2 3.7*  NEUTROABS 3.4  --   --   --   --   --   HGB 10.2* 8.9* 9.8* 9.4* 10.0* 9.4*  HCT 30.5* 27.4* 30.4* 28.8* 30.7* 28.4*  MCV 91.3 95.8 95.3 93.5 92.5 93.4  PLT 207 180 206 214 239 202   Basic Metabolic Panel: Recent Labs  Lab 09/10/22 0243 09/11/22 0251 09/12/22 0301 09/13/22 0635 09/14/22 0847  NA 136 137 137 135 134*  K 3.2* 3.4* 3.3* 3.7 3.6  CL 103 107 103 99 99  CO2 27 24 28 29 30   GLUCOSE 86 90 96 91 91  BUN 6 <5* <5* 7 6  CREATININE 0.94 0.92 0.99 1.07 1.10  CALCIUM 8.1* 8.5* 8.6* 9.1 8.2*  MG  --  1.9 1.9 1.8 1.8  PHOS  --  2.7  --   --   --    GFR: Estimated Creatinine Clearance: 83.2 mL/min (by C-G formula based on SCr of 1.1 mg/dL). Liver Function Tests: Recent Labs  Lab 09/08/22 2300  AST 53*  ALT 23  ALKPHOS 33*  BILITOT 0.3  PROT 8.1  ALBUMIN 4.1   No results for input(s): "LIPASE", "AMYLASE" in the last 168 hours. Recent Labs  Lab 09/10/22 0834  AMMONIA 18   Coagulation Profile: No results for input(s): "INR", "PROTIME" in the last 168 hours. Cardiac Enzymes: Recent Labs  Lab 09/10/22 0243 09/11/22 0251 09/12/22 0301 09/13/22 0635 09/14/22 0847  CKTOTAL 1,064* 901* 593* 408* 234   BNP (last 3 results) No results for input(s): "PROBNP" in the last 8760 hours. HbA1C: No results for input(s): "HGBA1C" in the last 72 hours. CBG: Recent Labs  Lab 09/13/22 1536 09/13/22 1918 09/13/22 2305 09/14/22 0326 09/14/22 0743  GLUCAP 93 95 102* 88 84   Lipid Profile: No results for input(s): "CHOL", "HDL", "LDLCALC", "TRIG", "CHOLHDL", "LDLDIRECT" in the last 72 hours. Thyroid Function Tests: No results for input(s): "TSH", "T4TOTAL", "FREET4", "T3FREE", "THYROIDAB" in the last 72  hours. Anemia Panel: No results for input(s): "VITAMINB12", "FOLATE", "FERRITIN", "TIBC", "IRON", "RETICCTPCT" in the last 72 hours. Sepsis Labs: No results for input(s): "PROCALCITON", "LATICACIDVEN" in the last 168 hours.  Recent Results (from the past 240 hour(s))  MRSA Next Gen by PCR, Nasal     Status: None   Collection Time: 09/09/22 11:17 AM   Specimen: Nasal Mucosa; Nasal Swab  Result Value Ref Range Status   MRSA by PCR Next Gen NOT DETECTED NOT DETECTED Final    Comment: (NOTE) The GeneXpert MRSA Assay (FDA approved for  NASAL specimens only), is one component of a comprehensive MRSA colonization surveillance program. It is not intended to diagnose MRSA infection nor to guide or monitor treatment for MRSA infections. Test performance is not FDA approved in patients less than 71 years old. Performed at [facility], [address] [place], [place] [address]          Radiology Studies: EEG adult  Result Date: 09/28/22 Charlsie Quest, MD     09-28-2022  8:59 PM Patient Name: Gary Bird MRN: [id number] Epilepsy Attending: Charlsie Quest Referring Physician/Provider: Marjorie Smolder, NP Date: Sep 28, 2022 Duration: 24.37 mins Patient history:  27 y.o. male with history of HIV untreated since last year, ADHD, depression, syphilis and paranoid schizophrenia who presented with altered mental status and tremors. EEG to evaluate for seizure. Level of alertness: Awake, drowsy AEDs during EEG study: Ativan Technical aspects: This EEG study was done with scalp electrodes positioned according to the 10-20 International system of electrode placement. Electrical activity was reviewed with band pass filter of 1-70Hz , sensitivity of 7 uV/mm, display speed of 82mm/sec with a 60Hz  notched filter applied as appropriate. EEG data were recorded continuously and digitally stored.  Video monitoring was available and reviewed as appropriate. Description: The posterior  dominant rhythm consists of 9 Hz activity of moderate voltage (25-35 uV) seen predominantly in posterior head regions, symmetric and reactive to eye opening and eye closing. Drowsiness was characterized by attenuation of the posterior background rhythm. Hyperventilation and photic stimulation were not performed.   IMPRESSION: This study is within normal limits. No seizures or epileptiform discharges were seen throughout the recording. A normal interictal EEG does not exclude the diagnosis of epilepsy. Priyanka O Yadav           LOS: 5 days   Time spent= 35 mins    Miguel Rota, MD [facility]  If 7PM-7AM, please contact night-coverage  09/14/2022, 11:38 AM

## 2022-09-15 ENCOUNTER — Other Ambulatory Visit (HOSPITAL_COMMUNITY): Payer: Self-pay

## 2022-09-15 DIAGNOSIS — G210 Malignant neuroleptic syndrome: Secondary | ICD-10-CM | POA: Diagnosis not present

## 2022-09-15 LAB — CBC
HCT: 31.2 % — ABNORMAL LOW (ref 39.0–52.0)
Hemoglobin: 10.1 g/dL — ABNORMAL LOW (ref 13.0–17.0)
MCH: 30.8 pg (ref 26.0–34.0)
MCHC: 32.4 g/dL (ref 30.0–36.0)
MCV: 95.1 fL (ref 80.0–100.0)
Platelets: 222 K/uL (ref 150–400)
RBC: 3.28 MIL/uL — ABNORMAL LOW (ref 4.22–5.81)
RDW: 12.2 % (ref 11.5–15.5)
WBC: 4 K/uL (ref 4.0–10.5)
nRBC: 0 % (ref 0.0–0.2)

## 2022-09-15 LAB — GLUCOSE, CAPILLARY
Glucose-Capillary: 100 mg/dL — ABNORMAL HIGH (ref 70–99)
Glucose-Capillary: 104 mg/dL — ABNORMAL HIGH (ref 70–99)
Glucose-Capillary: 85 mg/dL (ref 70–99)
Glucose-Capillary: 93 mg/dL (ref 70–99)
Glucose-Capillary: 95 mg/dL (ref 70–99)

## 2022-09-15 LAB — BASIC METABOLIC PANEL
Anion gap: 4 — ABNORMAL LOW (ref 5–15)
BUN: 12 mg/dL (ref 6–20)
CO2: 29 mmol/L (ref 22–32)
Calcium: 8.7 mg/dL — ABNORMAL LOW (ref 8.9–10.3)
Chloride: 101 mmol/L (ref 98–111)
Creatinine, Ser: 1.19 mg/dL (ref 0.61–1.24)
GFR, Estimated: >60 mL/min (ref >60)
Glucose, Bld: 96 mg/dL (ref 70–99)
Potassium: 4 mmol/L (ref 3.5–5.1)
Sodium: 134 mmol/L — ABNORMAL LOW (ref 135–145)

## 2022-09-15 LAB — HIV-1 RNA QUANT-NO REFLEX-BLD
HIV 1 RNA Quant: 32100 {copies}/mL
LOG10 HIV-1 RNA: 4.507 {Log_copies}/mL

## 2022-09-15 LAB — MAGNESIUM: Magnesium: 1.9 mg/dL (ref 1.7–2.4)

## 2022-09-15 LAB — CK: Total CK: 194 U/L (ref 49–397)

## 2022-09-15 MED ORDER — BENZTROPINE MESYLATE 1 MG PO TABS
1.0000 mg | ORAL_TABLET | Freq: Two times a day (BID) | ORAL | 0 refills | Status: AC
  Filled 2022-09-15: qty 60, 30d supply, fill #0

## 2022-09-15 MED ORDER — BICTEGRAVIR-EMTRICITAB-TENOFOV 50-200-25 MG PO TABS
1.0000 | ORAL_TABLET | Freq: Every day | ORAL | 0 refills | Status: AC
  Filled 2022-09-15: qty 30, 30d supply, fill #0

## 2022-09-15 NOTE — Plan of Care (Signed)

## 2022-09-15 NOTE — Plan of Care (Signed)
LP performed yesterday. Clear, colorless CSF with normal glucose, normal WBC and low protein of 13. The low protein is of uncertain significance but we do not feel that this needs to be further worked up. Patient's presentation is felt most likely to have been secondary to neuroleptic malignant syndrome.  Neurohospitalist service will sign off. Please call if there are additional questions.   Electronically signed: Dr. Caryl Pina

## 2022-09-15 NOTE — Discharge Summary (Signed)
Physician Discharge Summary  Gary Bird ZOX:[id number] DOB: [birth year] DOA: 09/08/2022  PCP: Patient, No Pcp Per  Admit date: 09/08/2022 Discharge date: 09/15/2022  Admitted From: Home Disposition:  Home  Recommendations for Outpatient Follow-up:  Follow up with PCP in 1-2 weeks Please obtain BMP/CBC in one week your next doctors visit.  Discontinue Haldol Depo Cogentin 1mg  BID Biktarvy prescribed, follow up with Outpatient ID   Discharge Condition: Stable CODE STATUS: Full Diet recommendation: Regular.   Brief/Interim Summary:  Brief Narrative:  27 year old with history of untreated HIV, ADHD, depression, history of syphilis, paranoid schizophrenia comes to the hospital with altered mental status and tremors.  He was admitted to [facility] on 8/13 for same complaints.  He has been noncompliant with his HIV medications for several months now.  He was IVC on 8/4 and hospitalized until 8/9 in behavioral unit in [facility].  On discharge she was given 200 mg of IM Haldol Depo shot.  On the day of admission mother noted that he was very confused lethargic with some staring spell therefore brought him to the hospital.  He was noted to be hypotensive with an rhabdomyolysis.  Admitted to the hospital where he ended up leaving AMA.  Presented again to med [facility] with shaking and similar complaints and rhabdomyolysis and eventually admitted back again to the hospital.  Patient seen by psychiatry, medications adjusted and Haldol discontinued.  The brain is normal, neurology consulted, MRI and EEG are neg. LP has been ordered which was negative. Seen by ID as well and patient was restarted on his Biktarvy. Today he is doing significantly well although blood test and studies have been negative in the hospital.  Mentally patient is stable and back to baseline.  Ready for discharge.         Altered mental status and tremors History of ADHD/depression/paranoid schizophrenia -Initial  concerns of neuroleptic malignant syndrome but there was no obvious evidence of fever or rigidity.  Metabolic workup has been negative including TSH, B12, folate, ammonia levels.  UDS only positive for THC.  No obvious evidence of infection.  MRI brain, EEG, lumbar puncture all has been negative.  Haldol Depo has been discontinued going forward.  Patient has been seen by psychiatry, now started on benztropine 1 mg twice daily and lorazepam.  Patient will need outpatient follow-up with psychiatry. -Currently mentation is back to baseline therefore we will plan on discharging him with outpatient follow-up   Rhabdomyolysis, solved -Admission CPK 1589, improving with IV fluids   Hypokalemia -Replete   History of HIV Untreated since February 2023. ID consulted.  Biktarvy started     DVT prophylaxis: enoxaparin (LOVENOX) injection 40 mg Start: 09/09/22 1200 SCDs Start: 09/09/22 1058 Code Status: Full code Family Communication: Mother at bedside On going management for AMS     Discharge Diagnoses:  Principal Problem:   Neuroleptic malignant syndrome Active Problems:   Antipsychotic-induced neurological movement disorder      Consultations: Psych Neuro  Subjective: Doing better no complaints.   Discharge Exam: Vitals:   09/15/22 0526 09/15/22 1210  BP: (!) 101/59 109/70  Pulse: 77 84  Resp:  15  Temp: 98.2 F (36.8 C) 98.2 F (36.8 C)  SpO2: 98% 100%   Vitals:   09/14/22 1414 09/14/22 2200 09/15/22 0526 09/15/22 1210  BP: (!) 104/55 (!) 104/56 (!) 101/59 109/70  Pulse:  69 77 84  Resp:    15  Temp: 98.3 F (36.8 C) 98.5 F (36.9 C)  98.2 F (36.8 C) 98.2 F (36.8 C)  TempSrc: Oral Oral Oral   SpO2:  96% 98% 100%  Weight:      Height:        General: Pt is alert, awake, not in acute distress Cardiovascular: RRR, S1/S2 +, no rubs, no gallops Respiratory: CTA bilaterally, no wheezing, no rhonchi Abdominal: Soft, NT, ND, bowel sounds + Extremities: no edema, no  cyanosis  Discharge Instructions   Allergies as of 09/15/2022       Reactions   Haldol Decanoate [haloperidol Decanoate] Other (See Comments)   NMS like symptoms (CK elevated, tremors, tachycardia, muscle rigidity) over 7 day period that resulted in ICU admission.   Latex Rash        Medication List     STOP taking these medications    Genvoya 150-150-200-10 MG Tabs tablet Generic drug: elvitegravir-cobicistat-emtricitabine-tenofovir   haloperidol 5 MG tablet Commonly known as: HALDOL   metoCLOPramide 10 MG tablet Commonly known as: Reglan       TAKE these medications    benztropine 1 MG tablet Commonly known as: COGENTIN Take 1 tablet (1 mg total) by mouth 2 (two) times daily.   bictegravir-emtricitabine-tenofovir AF 50-200-25 MG Tabs tablet Commonly known as: BIKTARVY Take 1 tablet by mouth daily. Start taking on: September 16, 2022   hydrOXYzine 25 MG capsule Commonly known as: VISTARIL Take 25 mg by mouth daily as needed for anxiety.        Allergies  Allergen Reactions   Haldol Decanoate [Haloperidol Decanoate] Other (See Comments)    NMS like symptoms (CK elevated, tremors, tachycardia, muscle rigidity) over 7 day period that resulted in ICU admission.   Latex Rash    You were cared for by a hospitalist during your hospital stay. If you have any questions about your discharge medications or the care you received while you were in the hospital after you are discharged, you can call the unit and asked to speak with the hospitalist on call if the hospitalist that took care of you is not available. Once you are discharged, your primary care physician will handle any further medical issues. Please note that no refills for any discharge medications will be authorized once you are discharged, as it is imperative that you return to your primary care physician (or establish a relationship with a primary care physician if you do not have one) for your aftercare needs  so that they can reassess your need for medications and monitor your lab values.  You were cared for by a hospitalist during your hospital stay. If you have any questions about your discharge medications or the care you received while you were in the hospital after you are discharged, you can call the unit and asked to speak with the hospitalist on call if the hospitalist that took care of you is not available. Once you are discharged, your primary care physician will handle any further medical issues. Please note that NO REFILLS for any discharge medications will be authorized once you are discharged, as it is imperative that you return to your primary care physician (or establish a relationship with a primary care physician if you do not have one) for your aftercare needs so that they can reassess your need for medications and monitor your lab values.  Please request your Prim.MD to go over all Hospital Tests and Procedure/Radiological results at the follow up, please get all Hospital records sent to your Prim MD by signing hospital release before you go  home.  Get CBC, CMP, 2 view Chest X ray checked  by Primary MD during your next visit or SNF MD in 5-7 days ( we routinely change or add medications that can affect your baseline labs and fluid status, therefore we recommend that you get the mentioned basic workup next visit with your PCP, your PCP may decide not to get them or add new tests based on their clinical decision)  On your next visit with your primary care physician please Get Medicines reviewed and adjusted.  If you experience worsening of your admission symptoms, develop shortness of breath, life threatening emergency, suicidal or homicidal thoughts you must seek medical attention immediately by calling 911 or calling your MD immediately  if symptoms less severe.  You Must read complete instructions/literature along with all the possible adverse reactions/side effects for all the Medicines  you take and that have been prescribed to you. Take any new Medicines after you have completely understood and accpet all the possible adverse reactions/side effects.   Do not drive, operate heavy machinery, perform activities at heights, swimming or participation in water activities or provide baby sitting services if your were admitted for syncope or siezures until you have seen by Primary MD or a Neurologist and advised to do so again.  Do not drive when taking Pain medications.   Procedures/Studies: DG FL GUIDED LUMBAR PUNCTURE  Result Date: 09/14/2022 CLINICAL DATA:  Cephalopathy.  HIV positive. EXAM: DIAGNOSTIC LUMBAR PUNCTURE UNDER FLUOROSCOPIC GUIDANCE FLUOROSCOPY: Radiation Exposure Index (as provided by the fluoroscopic device): 3.6 PROCEDURE: Informed consent was obtained from the patient's mother prior to the procedure, including potential complications of headache, allergy, and pain. With the patient prone, the lower back was prepped with Betadine. 1% Lidocaine was used for local anesthesia. Lumbar puncture was performed at the L2-L3 level using a 20 gauge needle with return of clear CSF with an opening pressure of 15 cm water. Eight ml of CSF were obtained for laboratory studies. The patient tolerated the procedure well and there were no apparent complications. IMPRESSION: Successful lumbar puncture. Electronically Signed   By: Genevive Bi M.D.   On: 09/14/2022 14:01   EEG adult  Result Date: 09/12/2022 Charlsie Quest, MD     09/12/2022  8:59 PM Patient Name: LAYKIN BAUTISTA MRN: [id number] Epilepsy Attending: Charlsie Quest Referring Physician/Provider: Marjorie Smolder, NP Date: 09/12/2022 Duration: 24.37 mins Patient history:  27 y.o. male with history of HIV untreated since last year, ADHD, depression, syphilis and paranoid schizophrenia who presented with altered mental status and tremors. EEG to evaluate for seizure. Level of alertness: Awake, drowsy AEDs during EEG  study: Ativan Technical aspects: This EEG study was done with scalp electrodes positioned according to the 10-20 International system of electrode placement. Electrical activity was reviewed with band pass filter of 1-70Hz , sensitivity of 7 uV/mm, display speed of 39mm/sec with a 60Hz  notched filter applied as appropriate. EEG data were recorded continuously and digitally stored.  Video monitoring was available and reviewed as appropriate. Description: The posterior dominant rhythm consists of 9 Hz activity of moderate voltage (25-35 uV) seen predominantly in posterior head regions, symmetric and reactive to eye opening and eye closing. Drowsiness was characterized by attenuation of the posterior background rhythm. Hyperventilation and photic stimulation were not performed.   IMPRESSION: This study is within normal limits. No seizures or epileptiform discharges were seen throughout the recording. A normal interictal EEG does not exclude the diagnosis of epilepsy. Priyanka O  Melynda Ripple   MR BRAIN W WO CONTRAST  Result Date: 09/11/2022 CLINICAL DATA:  Meningitis/CNS infection suspected. EXAM: MRI HEAD WITHOUT AND WITH CONTRAST TECHNIQUE: Multiplanar, multiecho pulse sequences of the brain and surrounding structures were obtained without and with intravenous contrast. CONTRAST:  6mL GADAVIST GADOBUTROL 1 MMOL/ML IV SOLN COMPARISON:  Head CT 09/06/2022. FINDINGS: Brain: No acute infarct or hemorrhage. No hydrocephalus or extra-axial collection. No foci of abnormal susceptibility. No mass or abnormal enhancement. Vascular: Normal flow voids and vessel enhancement. Skull and upper cervical spine: Normal marrow signal and enhancement. Sinuses/Orbits: No acute findings. Other: None. IMPRESSION: Normal brain MRI. Electronically Signed   By: Orvan Falconer M.D.   On: 09/11/2022 17:07   CT HEAD WO CONTRAST ( )  Result Date: 09/06/2022 CLINICAL DATA:  Mental status change, unknown cause EXAM: CT HEAD WITHOUT CONTRAST  TECHNIQUE: Contiguous axial images were obtained from the base of the skull through the vertex without intravenous contrast. RADIATION DOSE REDUCTION: This exam was performed according to the departmental dose-optimization program which includes automated exposure control, adjustment of the mA and/or kV according to patient size and/or use of iterative reconstruction technique. COMPARISON:  CT head 07/01/2013 FINDINGS: Brain: No evidence of large-territorial acute infarction. No parenchymal hemorrhage. No mass lesion. No extra-axial collection. No mass effect or midline shift. No hydrocephalus. Basilar cisterns are patent. Vascular: No hyperdense vessel. Skull: No acute fracture or focal lesion. Sinuses/Orbits: Paranasal sinuses and mastoid air cells are clear. The orbits are unremarkable. Other: None. IMPRESSION: No acute intracranial abnormality. Electronically Signed   By: Tish Frederickson M.D.   On: 09/06/2022 19:15   DG CHEST PORT 1 VIEW  Result Date: 09/06/2022 CLINICAL DATA:  Change in mental status. EXAM: PORTABLE CHEST 1 VIEW COMPARISON:  07/19/2018 FINDINGS: The cardiomediastinal contours are normal. The lungs are clear. Pulmonary vasculature is normal. No consolidation, pleural effusion, or pneumothorax. Broad-based scoliosis which has progressed from 2020. No acute osseous abnormalities are seen. IMPRESSION: 1. No acute chest findings. 2. Broad-based scoliosis, progressed from 2020. Electronically Signed   By: Narda Rutherford M.D.   On: 09/06/2022 18:11     The results of significant diagnostics from this hospitalization (including imaging, microbiology, ancillary and laboratory) are listed below for reference.     Microbiology: Recent Results (from the past 240 hour(s))  MRSA Next Gen by PCR, Nasal     Status: None   Collection Time: 09/09/22 11:17 AM   Specimen: Nasal Mucosa; Nasal Swab  Result Value Ref Range Status   MRSA by PCR Next Gen NOT DETECTED NOT DETECTED Final    Comment:  (NOTE) The GeneXpert MRSA Assay (FDA approved for NASAL specimens only), is one component of a comprehensive MRSA colonization surveillance program. It is not intended to diagnose MRSA infection nor to guide or monitor treatment for MRSA infections. Test performance is not FDA approved in patients less than 47 years old. Performed at [facility], [address] [place], [place] [address]   CSF culture w Gram Stain     Status: None (Preliminary result)   Collection Time: 09/14/22 12:56 PM   Specimen: CSF; Cerebrospinal Fluid  Result Value Ref Range Status   Specimen Description   Final    CSF Performed at [facility], [address], [place], [place] [address]    Special Requests   Final    NONE Performed at [facility], [address], [place], [place] [address]    Gram Stain  Final    NO WBC SEEN NO ORGANISMS SEEN CYTOSPIN SMEAR Gram Stain Report Called to,Read Back By and Verified With: S.CALVIN, RN AT 1500 ON 08.21.24 BY N.THOMPSON Performed at [facility], [address], [place], [place] [address]    Culture   Final    NO GROWTH < 12 HOURS Performed at [facility], [address], [place], [place] [address]    Report Status PENDING  Incomplete  Culture, fungus without smear     Status: None (Preliminary result)   Collection Time: 09/14/22 12:56 PM   Specimen: CSF; Other  Result Value Ref Range Status   Specimen Description   Final    CSF Performed at [facility], [address], [place], [place] [address]    Special Requests   Final    NONE Performed at [facility], [address], [place], [place] [address]    Culture   Final    NO FUNGUS ISOLATED AFTER 1 DAY Performed at [facility], [address], [place], [place] [address]    Report Status PENDING  Incomplete     Labs: BNP (last 3 results) No results for input(s): "BNP"  in the last 8760 hours. Basic Metabolic Panel: Recent Labs  Lab 09/11/22 0251 09/12/22 0301 09/13/22 0635 09/14/22 0847 09/15/22 0524  NA 137 137 135 134* 134*  K 3.4* 3.3* 3.7 3.6 4.0  CL 107 103 99 99 101  CO2 24 28 29 30 29   GLUCOSE 90 96 91 91 96  BUN <5* <5* 7 6 12   CREATININE 0.92 0.99 1.07 1.10 1.19  CALCIUM 8.5* 8.6* 9.1 8.2* 8.7*  MG 1.9 1.9 1.8 1.8 1.9  PHOS 2.7  --   --   --   --    Liver Function Tests: Recent Labs  Lab 09/08/22 2300  AST 53*  ALT 23  ALKPHOS 33*  BILITOT 0.3  PROT 8.1  ALBUMIN 4.1   No results for input(s): "LIPASE", "AMYLASE" in the last 168 hours. Recent Labs  Lab 09/10/22 0834  AMMONIA 18   CBC: Recent Labs  Lab 09/08/22 2300 09/10/22 0243 09/11/22 0251 09/12/22 0301 09/13/22 0635 09/14/22 0847 09/15/22 0524  WBC 6.5   < > 5.1 3.6* 4.2 3.7* 4.0  NEUTROABS 3.4  --   --   --   --   --   --   HGB 10.2*   < > 9.8* 9.4* 10.0* 9.4* 10.1*  HCT 30.5*   < > 30.4* 28.8* 30.7* 28.4* 31.2*  MCV 91.3   < > 95.3 93.5 92.5 93.4 95.1  PLT 207   < > 206 214 239 202 222   < > = values in this interval not displayed.   Cardiac Enzymes: Recent Labs  Lab 09/11/22 0251 09/12/22 0301 09/13/22 0635 09/14/22 0847 09/15/22 0524  CKTOTAL 901* 593* 408* 234 194   BNP: Invalid input(s): "POCBNP" CBG: Recent Labs  Lab 09/14/22 2032 09/15/22 0054 09/15/22 0401 09/15/22 0724 09/15/22 1224  GLUCAP 100* 93 95 85 100*   D-Dimer No results for input(s): "DDIMER" in the last 72 hours. Hgb A1c No results for input(s): "HGBA1C" in the last 72 hours. Lipid Profile No results for input(s): "CHOL", "HDL", "LDLCALC", "TRIG", "CHOLHDL", "LDLDIRECT" in the last 72 hours. Thyroid function studies No results for input(s): "TSH", "T4TOTAL", "T3FREE", "THYROIDAB" in the last 72 hours.  Invalid input(s): "FREET3" Anemia work up No results for input(s): "VITAMINB12", "FOLATE", "FERRITIN", "  TIBC", "IRON", "RETICCTPCT" in the last 72  hours. Urinalysis    Component Value Date/Time   COLORURINE YELLOW 09/06/2022 1743   APPEARANCEUR CLEAR 09/06/2022 1743   LABSPEC 1.028 09/06/2022 1743   PHURINE 5.0 09/06/2022 1743   GLUCOSEU NEGATIVE 09/06/2022 1743   HGBUR NEGATIVE 09/06/2022 1743   BILIRUBINUR NEGATIVE 09/06/2022 1743   KETONESUR NEGATIVE 09/06/2022 1743   PROTEINUR NEGATIVE 09/06/2022 1743   UROBILINOGEN 1.0 08/12/2014 1415   NITRITE NEGATIVE 09/06/2022 1743   LEUKOCYTESUR NEGATIVE 09/06/2022 1743   Sepsis Labs Recent Labs  Lab 09/12/22 0301 09/13/22 0635 09/14/22 0847 09/15/22 0524  WBC 3.6* 4.2 3.7* 4.0   Microbiology Recent Results (from the past 240 hour(s))  MRSA Next Gen by PCR, Nasal     Status: None   Collection Time: 09/09/22 11:17 AM   Specimen: Nasal Mucosa; Nasal Swab  Result Value Ref Range Status   MRSA by PCR Next Gen NOT DETECTED NOT DETECTED Final    Comment: (NOTE) The GeneXpert MRSA Assay (FDA approved for NASAL specimens only), is one component of a comprehensive MRSA colonization surveillance program. It is not intended to diagnose MRSA infection nor to guide or monitor treatment for MRSA infections. Test performance is not FDA approved in patients less than 78 years old. Performed at [facility], [address], [place], [place] [address]   CSF culture w Gram Stain     Status: None (Preliminary result)   Collection Time: 09/14/22 12:56 PM   Specimen: CSF; Cerebrospinal Fluid  Result Value Ref Range Status   Specimen Description   Final    CSF Performed at [facility], [address] [place], [place] [address]    Special Requests   Final    NONE Performed at [facility], [address], [place], [place] [address]    Gram Stain   Final    NO WBC SEEN NO ORGANISMS SEEN CYTOSPIN SMEAR Gram Stain Report Called to,Read Back By and Verified With: S.CALVIN, RN AT 1500 ON 08.21.24 BY N.THOMPSON Performed at [facility], [address] [place], [place] [address]    Culture   Final    NO GROWTH < 12 HOURS Performed at [facility], [address] [place], [place] [address]    Report Status PENDING  Incomplete  Culture, fungus without smear     Status: None (Preliminary result)   Collection Time: 09/14/22 12:56 PM   Specimen: CSF; Other  Result Value Ref Range Status   Specimen Description   Final    CSF Performed at [facility], [address], [place], [place] [address]    Special Requests   Final    NONE Performed at [facility], [address], [place], [place] [address]    Culture   Final    NO FUNGUS ISOLATED AFTER 1 DAY Performed at [facility], [address] [place], [place] [address]    Report Status PENDING  Incomplete     Time coordinating discharge:  I have spent 35 minutes face to face with the patient and on the ward discussing the patients care, assessment, plan and disposition with other care givers. >50% of the time was devoted counseling the patient about the risks and benefits of treatment/Discharge disposition and coordinating care.   SIGNED:   Miguel Rota, MD  [facility] 09/15/2022, 2:33 PM   If 7PM-7AM, please contact night-coverage

## 2022-09-15 NOTE — Plan of Care (Signed)

## 2022-09-15 NOTE — TOC Progression Note (Signed)
Transition of Care [facility]) - Progression Note    Patient Details  Name: Gary Bird MRN: [id number] Date of Birth: June 30, 1995  Transition of Care [facility]) CM/SW Contact  Otelia Santee, LCSW Phone Number: 09/15/2022, 11:20 AM  Clinical Narrative:    Commitment Change to rescind IVC completed and submitted to the courts e-file and serve system. Envelope [id number].   Expected Discharge Plan: Home/Self Care    Expected Discharge Plan and Services                                               Social Determinants of Health (SDOH) Interventions SDOH Screenings   Food Insecurity: Patient Unable To Answer (09/09/2022)  Housing: Patient Unable To Answer (09/09/2022)  Transportation Needs: Patient Unable To Answer (09/09/2022)  Utilities: Patient Unable To Answer (09/09/2022)  Tobacco Use: High Risk (09/08/2022)    Readmission Risk Interventions    09/14/2022   12:26 PM 09/12/2022    2:16 PM  Readmission Risk Prevention Plan  Transportation Screening Complete Complete  PCP or Specialist Appt within 5-7 Days Complete Complete  Home Care Screening Complete Complete  Medication Review (RN CM) Complete Complete

## 2022-09-16 LAB — VDRL, CSF: VDRL Quant, CSF: NONREACTIVE

## 2022-09-18 LAB — CSF CULTURE W GRAM STAIN
Culture: NO GROWTH
Gram Stain: NONE SEEN

## 2022-09-19 LAB — ANAEROBIC CULTURE W GRAM STAIN

## 2022-09-20 LAB — ACID FAST SMEAR (AFB, MYCOBACTERIA): Acid Fast Smear: NEGATIVE

## 2022-09-23 LAB — GENOSURE INTEGRASE HIV EDI: HIV Genosure Integrase PDF 2: 1

## 2022-09-23 LAB — HIV GENOSURE(R) MG

## 2022-09-23 LAB — HIV GENOSURE REFLEX - HIVGTY - ELECTRONIC RECORD

## 2022-09-23 LAB — REFLEX TO GENOSURE(R) MG EDI: HIV GenoSure(R): 1

## 2022-09-23 LAB — HIV-1 RNA, PCR (GRAPH) RFX/GENO EDI
HIV-1 RNA BY PCR: 21000 {copies}/mL
HIV-1 RNA Quant, Log: 4.322 {Log_copies}/mL

## 2022-10-06 LAB — CULTURE, FUNGUS WITHOUT SMEAR

## 2022-11-01 LAB — ACID FAST CULTURE WITH REFLEXED SENSITIVITIES (MYCOBACTERIA): Acid Fast Culture: NEGATIVE
